# Patient Record
Sex: Female | Born: 1975 | Race: White | Hispanic: No | Marital: Married | Smoking: Never smoker
Health system: Southern US, Community
[De-identification: ages and names within clinical notes are randomized; demographics above are authoritative.]

## PROBLEM LIST (undated history)

## (undated) DIAGNOSIS — D649 Anemia, unspecified: Secondary | ICD-10-CM

## (undated) DIAGNOSIS — F419 Anxiety disorder, unspecified: Secondary | ICD-10-CM

## (undated) DIAGNOSIS — J302 Other seasonal allergic rhinitis: Secondary | ICD-10-CM

## (undated) HISTORY — PX: WISDOM TOOTH EXTRACTION: SHX21

---

## 2001-07-12 ENCOUNTER — Emergency Department (HOSPITAL_COMMUNITY): Admission: EM | Admit: 2001-07-12 | Discharge: 2001-07-13 | Payer: Self-pay | Admitting: Emergency Medicine

## 2008-11-14 ENCOUNTER — Encounter: Admission: RE | Admit: 2008-11-14 | Discharge: 2008-11-14 | Payer: Self-pay | Admitting: Orthopedic Surgery

## 2013-11-02 ENCOUNTER — Emergency Department (HOSPITAL_BASED_OUTPATIENT_CLINIC_OR_DEPARTMENT_OTHER): Payer: Commercial Managed Care - PPO

## 2013-11-02 ENCOUNTER — Emergency Department (HOSPITAL_BASED_OUTPATIENT_CLINIC_OR_DEPARTMENT_OTHER)
Admission: EM | Admit: 2013-11-02 | Discharge: 2013-11-03 | Disposition: A | Discharge status: Home / Self Care | Payer: Commercial Managed Care - PPO | Attending: Emergency Medicine | Admitting: Emergency Medicine

## 2013-11-02 ENCOUNTER — Encounter (HOSPITAL_BASED_OUTPATIENT_CLINIC_OR_DEPARTMENT_OTHER): Payer: Self-pay | Admitting: Emergency Medicine

## 2013-11-02 DIAGNOSIS — Y9289 Other specified places as the place of occurrence of the external cause: Secondary | ICD-10-CM | POA: Insufficient documentation

## 2013-11-02 DIAGNOSIS — S52122A Displaced fracture of head of left radius, initial encounter for closed fracture: Secondary | ICD-10-CM

## 2013-11-02 DIAGNOSIS — Y9389 Activity, other specified: Secondary | ICD-10-CM | POA: Insufficient documentation

## 2013-11-02 DIAGNOSIS — S52123A Displaced fracture of head of unspecified radius, initial encounter for closed fracture: Secondary | ICD-10-CM | POA: Insufficient documentation

## 2013-11-02 DIAGNOSIS — W010XXA Fall on same level from slipping, tripping and stumbling without subsequent striking against object, initial encounter: Secondary | ICD-10-CM | POA: Insufficient documentation

## 2013-11-02 DIAGNOSIS — S6990XA Unspecified injury of unspecified wrist, hand and finger(s), initial encounter: Secondary | ICD-10-CM

## 2013-11-02 DIAGNOSIS — S59909A Unspecified injury of unspecified elbow, initial encounter: Secondary | ICD-10-CM | POA: Insufficient documentation

## 2013-11-02 DIAGNOSIS — S59919A Unspecified injury of unspecified forearm, initial encounter: Secondary | ICD-10-CM

## 2013-11-02 MED ORDER — OXYCODONE-ACETAMINOPHEN 5-325 MG PO TABS
1.0000 | ORAL_TABLET | Freq: Once | ORAL | Status: AC
  Administered 2013-11-02: 1 via ORAL
  Filled 2013-11-02: qty 1

## 2013-11-02 MED ORDER — ONDANSETRON 8 MG PO TBDP
8.0000 mg | ORAL_TABLET | Freq: Once | ORAL | Status: AC
  Administered 2013-11-02: 8 mg via ORAL
  Filled 2013-11-02: qty 1

## 2013-11-02 NOTE — ED Notes (Addendum)
Pt fell on wet kitchen floor, landing on left elbow and left hip.  Pain most intense in left elbow.  Pt is walking without apparent difficulty. Took Ibuprofen 600mg  at 2215.

## 2013-11-02 NOTE — ED Provider Notes (Signed)
CSN: [id number]     Arrival date & time 11/02/13  2225 History  This chart was scribed for Peggy Gaskinsonald W Azariya Freeman, MD by Modena JanskyAlbert Thayil, ED Scribe. This patient was seen in room MH08/MH08 and the patient's care was started at 11:40 PM.   Chief Complaint  Patient presents with  . Arm Injury   Patient is a 38 y.o. female presenting with arm injury. The history is provided by the patient and the spouse. No language interpreter was used.  Arm Injury Location:  Arm and elbow Time since incident:  3 hours Arm location:  L arm Elbow location:  L elbow Pain details:    Quality:  Aching   Radiates to:  Does not radiate   Severity:  Moderate   Onset quality:  Sudden   Timing:  Constant   Progression:  Unchanged Chronicity:  New Dislocation: no   Foreign body present:  No foreign bodies  HPI Comments: [name] is a 38 y.o. female who presents to the Emergency Department complaining of a left upper extremity injury that occurred today. She states that she slipped and fell after mopping the kitchen. She states that hit her buttocks and left arm on the floor. She reports some numbness and tingling in the arm. She denies any LOC or abdominal pain.   PMH -none History  Substance Use Topics  . Smoking status: Never Smoker   . Smokeless tobacco: Not on file  . Alcohol Use: Not on file     Comment: Occ wine.  Had some this evening.   OB History   Grav Para Term Preterm Abortions TAB SAB Ect Mult Living                 Review of Systems  Gastrointestinal: Negative for abdominal pain.  Musculoskeletal: Positive for myalgias.  Neurological: Negative for syncope.  All other systems reviewed and are negative.    Allergies  Review of patient's allergies indicates no known allergies.  Home Medications   Prior to Admission medications   Not on File   BP 109/79  Pulse 57  Temp(Src) 98.6 F (37 C) (Oral)  Resp 16  Ht 5\' 9"  (1.753 m)  Wt 140 lb (63.504 kg)  BMI 20.67 kg/m2  SpO2 96%   LMP 10/27/2013 Physical Exam CONSTITUTIONAL: Well developed/well nourished HEAD: Normocephalic/atraumatic ENMT: Mucous membranes moist NECK: supple no meningeal signs SPINE:entire spine nontender CV: S1/S2 noted, no murmurs/rubs/gallops noted LUNGS: Lungs are clear to auscultation bilaterally, no apparent distress ABDOMEN: soft, nontender, no rebound or guarding NEURO: Pt is awake/alert, moves all extremitiesx4, pt is ambulatory  EXTREMITIES: pulses normal, full ROM, tenderness to left olecranon, no tenderness of ROM with left shoulder or wrist, no tenderness with ROM of lower extremities Distal radial/media/ulnar motor/sensory intact on left UE SKIN: warm, color normal PSYCH: no abnormalities of mood noted  ED Course  Procedures SPLINT APPLICATION Date/Time: 12:44 AM Authorized by: Peggy GaskinsWICKLINE,Josten Warmuth W Consent: Verbal consent obtained. Risks and benefits: risks, benefits and alternatives were discussed Consent given by: patient Splint applied by: orthopedic technician Location details: left upper extremity Splint type: long arm posterior Supplies used: fiberglass Post-procedure: The splinted body part was neurovascularly unchanged following the procedure. Patient tolerance: Patient tolerated the procedure well with no immediate complications.    DIAGNOSTIC STUDIES: Oxygen Saturation is 96% on RA, normal by my interpretation.    COORDINATION OF CARE: 11:44 PM- Pt advised of plan for treatment which includes medication and labs and pt agrees.  Advised need for  ortho f/u Pt agreeable  Imaging Review Dg Elbow Complete Left  11/03/2013   CLINICAL DATA:  Fall, at elbow pain.  EXAM: LEFT ELBOW - COMPLETE 3+ VIEW  COMPARISON:  None.  FINDINGS: Comminuted and impacted fracture of the radial head neck junction and alignment. No definite intra-articular extension. No dislocation. No destructive bony lesions. Small anterior joint effusion.  IMPRESSION: Impacted radial head neck junction  fracture without dislocation.   Electronically Signed   By: Awilda Metro   On: 11/03/2013 00:06     MDM   Final diagnoses:  Left radial head fracture, closed, initial encounter    Nursing notes including past medical history and social history reviewed and considered in documentation xrays reviewed and considered  I personally performed the services described in this documentation, which was scribed in my presence. The recorded information has been reviewed and is accurate.       Peggy Gaskins, MD 11/03/13 [phone]

## 2013-11-02 NOTE — ED Notes (Signed)
Patient transported to X-ray 

## 2013-11-03 MED ORDER — OXYCODONE-ACETAMINOPHEN 5-325 MG PO TABS: 1.0000 | ORAL_TABLET | ORAL | Status: DC | PRN

## 2013-11-03 MED ORDER — ONDANSETRON HCL 8 MG PO TABS: 8.0000 mg | ORAL_TABLET | Freq: Three times a day (TID) | ORAL | Status: DC | PRN

## 2013-11-03 NOTE — Discharge Instructions (Signed)

## 2015-06-27 ENCOUNTER — Encounter (HOSPITAL_COMMUNITY): Payer: Self-pay | Admitting: *Deleted

## 2015-06-27 NOTE — H&P (Signed)
[name]  DICTATION # [id number] CSN# [id number]   [name],[name], MD 06/27/2015 9:19 AM

## 2015-06-27 NOTE — H&P (Signed)
NAMDelton Hawkins:  Hawkins, [name]                 ACCOUNT NO.:  [id number]  MEDICAL RECORD NO.:  [id number]  LOCATION:  [facility]IO                         FACILITY:  [facility]  PHYSICIAN:  [name], M.D.DATE OF BIRTH:  February 02, 1976  DATE OF ADMISSION:  06/26/2015 DATE OF DISCHARGE:                             HISTORY & PHYSICAL   CHIEF COMPLAINT:  Missed AB.  HPI:  A 40 year old, G1, P0, seen as a transfer OB in the office yesterday where her pregnancy confirmation ultrasound showed 9 week IUP. There was no FHR movement noted.  She presents now for D and E.  This procedure including specific risks related to bleeding, infection, other complications such as perforation, may require additional surgery, reviewed with her.  She is A negative.  PAST MEDICAL HISTORY:  Allergies:  None.  There is no significant surgical or medical history.  Please see the [facility] form for family and social history.  PHYSICAL EXAM:  VITAL SIGNS:  Temp 98.2, blood pressure 120/78. HEENT:  Unremarkable. NECK:  Supple without masses. LUNGS:  Clear. CARDIOVASCULAR:  Regular rate and rhythm without murmurs, rubs, gallops noted. BREASTS:  Without masses. ABDOMEN:  Soft, flat, nontender.  Vulva, vagina, cervix normal.  Uterus 9 week size mid position.  Adnexa negative. EXTREMITIES:  Unremarkable. NEUROLOGIC:  Unremarkable.  IMPRESSION:  A 9-week missed abortion.  PLAN:  D and E procedure and risks discussed as above.     Martie Fulgham M. Marcelle Hawkins, M.D.     [facility]/MEDQ  D:  06/27/2015  T:  06/27/2015  Job:  [id number]

## 2015-06-28 ENCOUNTER — Ambulatory Visit (HOSPITAL_COMMUNITY): Payer: Commercial Managed Care - PPO | Admitting: Anesthesiology

## 2015-06-28 ENCOUNTER — Ambulatory Visit (HOSPITAL_COMMUNITY)
Admission: RE | Admit: 2015-06-28 | Discharge: 2015-06-28 | Disposition: A | Discharge status: Home / Self Care | Payer: Commercial Managed Care - PPO | Source: Ambulatory Visit | Attending: Obstetrics and Gynecology | Admitting: Obstetrics and Gynecology

## 2015-06-28 ENCOUNTER — Encounter (HOSPITAL_COMMUNITY)
Admission: RE | Disposition: A | Discharge status: Home / Self Care | Payer: Self-pay | Source: Ambulatory Visit | Attending: Obstetrics and Gynecology

## 2015-06-28 ENCOUNTER — Encounter (HOSPITAL_COMMUNITY): Payer: Self-pay | Admitting: *Deleted

## 2015-06-28 DIAGNOSIS — O021 Missed abortion: Secondary | ICD-10-CM | POA: Insufficient documentation

## 2015-06-28 DIAGNOSIS — Z3A09 9 weeks gestation of pregnancy: Secondary | ICD-10-CM | POA: Insufficient documentation

## 2015-06-28 HISTORY — PX: DILATION AND EVACUATION: SHX1459

## 2015-06-28 HISTORY — DX: Other seasonal allergic rhinitis: J30.2

## 2015-06-28 HISTORY — DX: Anxiety disorder, unspecified: F41.9

## 2015-06-28 HISTORY — DX: Anemia, unspecified: D64.9

## 2015-06-28 LAB — TYPE AND SCREEN
ABO/RH(D): A NEG
ANTIBODY SCREEN: NEGATIVE

## 2015-06-28 LAB — CBC
HCT: 39 % (ref 36.0–46.0)
Hemoglobin: 13.4 g/dL (ref 12.0–15.0)
MCH: 33.6 pg (ref 26.0–34.0)
MCHC: 34.4 g/dL (ref 30.0–36.0)
MCV: 97.7 fL (ref 78.0–100.0)
PLATELETS: 192 10*3/uL (ref 150–400)
RBC: 3.99 MIL/uL (ref 3.87–5.11)
RDW: 13.8 % (ref 11.5–15.5)
WBC: 5.2 10*3/uL (ref 4.0–10.5)

## 2015-06-28 LAB — RH IG WORKUP (INCLUDES ABO/RH)
ABO/RH(D): A NEG
Gestational Age(Wks): 10
Unit division: 0

## 2015-06-28 LAB — ABO/RH: ABO/RH(D): A NEG

## 2015-06-28 SURGERY — DILATION AND EVACUATION, UTERUS
Anesthesia: Monitor Anesthesia Care | Site: Vagina

## 2015-06-28 MED ORDER — ONDANSETRON HCL 4 MG/2ML IJ SOLN
INTRAMUSCULAR | Status: DC | PRN
  Administered 2015-06-28: 4 mg via INTRAVENOUS

## 2015-06-28 MED ORDER — MIDAZOLAM HCL 2 MG/2ML IJ SOLN
INTRAMUSCULAR | Status: AC
  Filled 2015-06-28: qty 2

## 2015-06-28 MED ORDER — KETOROLAC TROMETHAMINE 30 MG/ML IJ SOLN
INTRAMUSCULAR | Status: DC | PRN
  Administered 2015-06-28: 30 mg via INTRAVENOUS

## 2015-06-28 MED ORDER — IBUPROFEN 200 MG PO TABS: 600.0000 mg | ORAL_TABLET | Freq: Four times a day (QID) | ORAL | Status: DC | PRN

## 2015-06-28 MED ORDER — FENTANYL CITRATE (PF) 100 MCG/2ML IJ SOLN
INTRAMUSCULAR | Status: DC | PRN
  Administered 2015-06-28 (×2): 50 ug via INTRAVENOUS

## 2015-06-28 MED ORDER — OXYCODONE-ACETAMINOPHEN 5-325 MG PO TABS: 1.0000 | ORAL_TABLET | ORAL | Status: DC | PRN

## 2015-06-28 MED ORDER — LIDOCAINE HCL (CARDIAC) 20 MG/ML IV SOLN
INTRAVENOUS | Status: DC | PRN
  Administered 2015-06-28: 50 mg via INTRAVENOUS

## 2015-06-28 MED ORDER — PROPOFOL 10 MG/ML IV BOLUS
INTRAVENOUS | Status: AC
  Filled 2015-06-28: qty 20

## 2015-06-28 MED ORDER — SCOPOLAMINE 1 MG/3DAYS TD PT72
1.0000 | MEDICATED_PATCH | Freq: Once | TRANSDERMAL | Status: DC
  Administered 2015-06-28: 1.5 mg via TRANSDERMAL

## 2015-06-28 MED ORDER — MIDAZOLAM HCL 5 MG/5ML IJ SOLN
INTRAMUSCULAR | Status: DC | PRN
  Administered 2015-06-28: 2 mg via INTRAVENOUS

## 2015-06-28 MED ORDER — PROPOFOL 10 MG/ML IV BOLUS
INTRAVENOUS | Status: DC | PRN
  Administered 2015-06-28 (×6): 30 mg via INTRAVENOUS

## 2015-06-28 MED ORDER — LIDOCAINE HCL 1 % IJ SOLN
INTRAMUSCULAR | Status: DC | PRN
  Administered 2015-06-28: 17 mL

## 2015-06-28 MED ORDER — DEXAMETHASONE SODIUM PHOSPHATE 4 MG/ML IJ SOLN
INTRAMUSCULAR | Status: AC
  Filled 2015-06-28: qty 1

## 2015-06-28 MED ORDER — LIDOCAINE HCL (CARDIAC) 20 MG/ML IV SOLN
INTRAVENOUS | Status: AC
  Filled 2015-06-28: qty 5

## 2015-06-28 MED ORDER — ONDANSETRON HCL 4 MG/2ML IJ SOLN: 4.0000 mg | Freq: Once | INTRAMUSCULAR | Status: DC | PRN

## 2015-06-28 MED ORDER — FENTANYL CITRATE (PF) 100 MCG/2ML IJ SOLN
INTRAMUSCULAR | Status: AC
  Filled 2015-06-28: qty 2

## 2015-06-28 MED ORDER — DEXAMETHASONE SODIUM PHOSPHATE 4 MG/ML IJ SOLN
INTRAMUSCULAR | Status: DC | PRN
  Administered 2015-06-28: 4 mg via INTRAVENOUS

## 2015-06-28 MED ORDER — ONDANSETRON HCL 4 MG/2ML IJ SOLN
INTRAMUSCULAR | Status: AC
  Filled 2015-06-28: qty 2

## 2015-06-28 MED ORDER — LACTATED RINGERS IV SOLN
INTRAVENOUS | Status: DC
  Administered 2015-06-28 (×2): via INTRAVENOUS

## 2015-06-28 MED ORDER — SCOPOLAMINE 1 MG/3DAYS TD PT72
MEDICATED_PATCH | TRANSDERMAL | Status: AC
  Administered 2015-06-28: 1.5 mg via TRANSDERMAL
  Filled 2015-06-28: qty 1

## 2015-06-28 MED ORDER — KETOROLAC TROMETHAMINE 30 MG/ML IJ SOLN
INTRAMUSCULAR | Status: AC
  Filled 2015-06-28: qty 1

## 2015-06-28 MED ORDER — FENTANYL CITRATE (PF) 100 MCG/2ML IJ SOLN: 25.0000 ug | INTRAMUSCULAR | Status: DC | PRN

## 2015-06-28 MED ORDER — LIDOCAINE HCL 1 % IJ SOLN
INTRAMUSCULAR | Status: AC
  Filled 2015-06-28: qty 20

## 2015-06-28 MED ORDER — RHO D IMMUNE GLOBULIN 1500 UNIT/2ML IJ SOSY
300.0000 ug | PREFILLED_SYRINGE | Freq: Once | INTRAMUSCULAR | Status: AC
  Administered 2015-06-28: 300 ug via INTRAVENOUS
  Filled 2015-06-28: qty 2

## 2015-06-28 SURGICAL SUPPLY — 18 items
CATH ROBINSON RED A/P 16FR (CATHETERS) ×2 IMPLANT
CLOTH BEACON ORANGE TIMEOUT ST (SAFETY) ×2 IMPLANT
DECANTER SPIKE VIAL GLASS SM (MISCELLANEOUS) ×2 IMPLANT
GLOVE BIO SURGEON STRL SZ7 (GLOVE) ×4 IMPLANT
GLOVE BIOGEL PI IND STRL 7.0 (GLOVE) ×1 IMPLANT
GLOVE BIOGEL PI INDICATOR 7.0 (GLOVE) ×1
GOWN STRL REUS W/TWL LRG LVL3 (GOWN DISPOSABLE) ×5 IMPLANT
KIT BERKELEY 1ST TRIMESTER 3/8 (MISCELLANEOUS) ×2 IMPLANT
NS IRRIG 1000ML POUR BTL (IV SOLUTION) ×2 IMPLANT
PACK VAGINAL MINOR WOMEN LF (CUSTOM PROCEDURE TRAY) ×2 IMPLANT
PAD OB MATERNITY 4.3X12.25 (PERSONAL CARE ITEMS) ×2 IMPLANT
PAD PREP 24X48 CUFFED NSTRL (MISCELLANEOUS) ×2 IMPLANT
SET BERKELEY SUCTION TUBING (SUCTIONS) ×2 IMPLANT
TOWEL OR 17X24 6PK STRL BLUE (TOWEL DISPOSABLE) ×4 IMPLANT
VACURETTE 10 RIGID CVD (CANNULA) IMPLANT
VACURETTE 7MM CVD STRL WRAP (CANNULA) ×1 IMPLANT
VACURETTE 8 RIGID CVD (CANNULA) IMPLANT
VACURETTE 9 RIGID CVD (CANNULA) IMPLANT

## 2015-06-28 NOTE — Transfer of Care (Signed)
Immediate Anesthesia Transfer of Care Note  Patient: [name]  Procedure(s) Performed: Procedure(s): DILATATION AND EVACUATION (N/A)  Patient Location: PACU  Anesthesia Type:MAC  Level of Consciousness: sedated  Airway & Oxygen Therapy: Patient Spontanous Breathing and Patient connected to nasal cannula oxygen  Post-op Assessment: Report given to RN  Post vital signs: Reviewed  Last Vitals:  Filed Vitals:   06/28/15 1035  BP: 108/70  Pulse: 61  Temp: 36.7 C  Resp: 18    Complications: No apparent anesthesia complications

## 2015-06-28 NOTE — Discharge Instructions (Signed)
DISCHARGE INSTRUCTIONS: D&C / D&E The following instructions have been prepared to help you care for yourself upon your return home.   Personal hygiene:  Use sanitary pads for vaginal drainage, not tampons.  Shower the day after your procedure.  NO tub baths, pools or Jacuzzis for 2-3 weeks.  Wipe front to back after using the bathroom.  Activity and limitations:  Do NOT drive or operate any equipment for 24 hours. The effects of anesthesia are still present and drowsiness may result.  Do NOT rest in bed all day.  Walking is encouraged.  Walk up and down stairs slowly.  You may resume your normal activity in one to two days or as indicated by your physician.  Sexual activity: NO intercourse for at least 2 weeks after the procedure, or as indicated by your physician.  Diet: Eat a light meal as desired this evening. You may resume your usual diet tomorrow.  Return to work: You may resume your work activities in one to two days or as indicated by your doctor.  What to expect after your surgery: Expect to have vaginal bleeding/discharge for 2-3 days and spotting for up to 10 days. It is not unusual to have soreness for up to 1-2 weeks. You may have a slight burning sensation when you urinate for the first day. Mild cramps may continue for a couple of days. You may have a regular period in 2-6 weeks.  NO IBUPROFEN PRODUCTS (MOTRIN, ADVIL) OR ALEVE UNTIL 6:20 PM TODAY.   Call your doctor for any of the following:  Excessive vaginal bleeding, saturating and changing one pad every hour.  Inability to urinate 6 hours after discharge from hospital.  Pain not relieved by pain medication.  Fever of 100.4 F or greater.  Unusual vaginal discharge or odor.   Call for an appointment:    Patients signature: ______________________  Nurses signature ________________________  Support person's signature_______________________

## 2015-06-28 NOTE — Anesthesia Preprocedure Evaluation (Addendum)
Anesthesia Evaluation  Patient identified by MRN, date of birth, ID band Patient awake    Reviewed: Allergy & Precautions, NPO status , Patient's Chart, lab work & pertinent test results  History of Anesthesia Complications Negative for: history of anesthetic complications  Airway Mallampati: II  TM Distance: >3 FB Neck ROM: Full    Dental no notable dental hx. (+) Dental Advisory Given   Pulmonary neg pulmonary ROS,    Pulmonary exam normal breath sounds clear to auscultation       Cardiovascular negative cardio ROS Normal cardiovascular exam Rhythm:Regular Rate:Normal     Neuro/Psych PSYCHIATRIC DISORDERS Anxiety negative neurological ROS     GI/Hepatic negative GI ROS, Neg liver ROS,   Endo/Other  negative endocrine ROS  Renal/GU negative Renal ROS  negative genitourinary   Musculoskeletal negative musculoskeletal ROS (+)   Abdominal   Peds negative pediatric ROS (+)  Hematology negative hematology ROS (+)   Anesthesia Other Findings   Reproductive/Obstetrics (+) Pregnancy 9 week missed ab                             Anesthesia Physical Anesthesia Plan  ASA: II  Anesthesia Plan: MAC   Post-op Pain Management:    Induction: Intravenous  Airway Management Planned: Nasal Cannula  Additional Equipment:   Intra-op Plan:   Post-operative Plan: Extubation in OR  Informed Consent: I have reviewed the patients History and Physical, chart, labs and discussed the procedure including the risks, benefits and alternatives for the proposed anesthesia with the patient or authorized representative who has indicated his/her understanding and acceptance.   Dental advisory given  Plan Discussed with: CRNA  Anesthesia Plan Comments:         Anesthesia Quick Evaluation

## 2015-06-28 NOTE — Anesthesia Postprocedure Evaluation (Signed)
Anesthesia Post Note  Patient: [name]  Procedure(s) Performed: Procedure(s) (LRB): DILATATION AND EVACUATION (N/A)  Patient location during evaluation: PACU Anesthesia Type: MAC Level of consciousness: awake and alert Pain management: pain level controlled Vital Signs Assessment: post-procedure vital signs reviewed and stable Respiratory status: spontaneous breathing, nonlabored ventilation, respiratory function stable and patient connected to nasal cannula oxygen Cardiovascular status: stable and blood pressure returned to baseline Anesthetic complications: no    Last Vitals:  Filed Vitals:   06/28/15 1231 06/28/15 1245  BP: 102/64 95/69  Pulse: 56 46  Temp: 36.6 C   Resp: 16 16    Last Pain: There were no vitals filed for this visit.               Jiles GarterJACKSON,Velisa Regnier EDWARD

## 2015-06-28 NOTE — Progress Notes (Signed)
The patient was re-examined with no change in status 

## 2015-06-28 NOTE — Op Note (Signed)
Preoperative diagnosis: Missed AB  Postoperative diagnosis: Same  Procedure: D&E  Surgeon: Marcelle OverlieHolland  Anesthesia: Gen.  EBL: 50 cc  Procedure and findings:  Patient taken the operating room after an adequate level of general anesthesia was obtained with the patient's legs in stirrups the perineum and vagina were prepped and draped and the bladder was drained. Appropriate timeouts were taken at that point.  EUA was carried out the uterus was 8-9 weeks size mid anterior, adnexa negative. Weighted speculum was positioned paracervical block was then created by infiltrating at 3 and 9:00 submucosally, 5-7 cc 1% plain Xylocaine at each site after negative aspiration. The uterus sounded to 9 cm, progressively dilated to a 27 Pratt dilator, #7 curved suction curet was then used to curette a moderate amount of tissue. When no further tissue could be removed a small blunt curette was used to explore the cavity revealing it to be clean there was minimal bleeding. She tolerated this well went to recovery room in good condition.  Dictated with [facility]  Kirk Sampley Milana ObeyM Burel Kahre M.D.

## 2015-06-29 LAB — RH IG WORKUP (INCLUDES ABO/RH)
ABO/RH(D): A NEG
GESTATIONAL AGE(WKS): 10
Unit division: 0

## 2015-07-02 ENCOUNTER — Encounter (HOSPITAL_COMMUNITY): Payer: Self-pay | Admitting: Obstetrics and Gynecology

## 2015-10-14 IMAGING — CR DG ELBOW COMPLETE 3+V*L*
4 series · 4 of 4 positions shown · non-contrast
Comparison: None.

CLINICAL DATA: Fall, at elbow pain.

EXAM:
LEFT ELBOW - COMPLETE 3+ VIEW

[x elbow joint obl. left (1 of 3)]
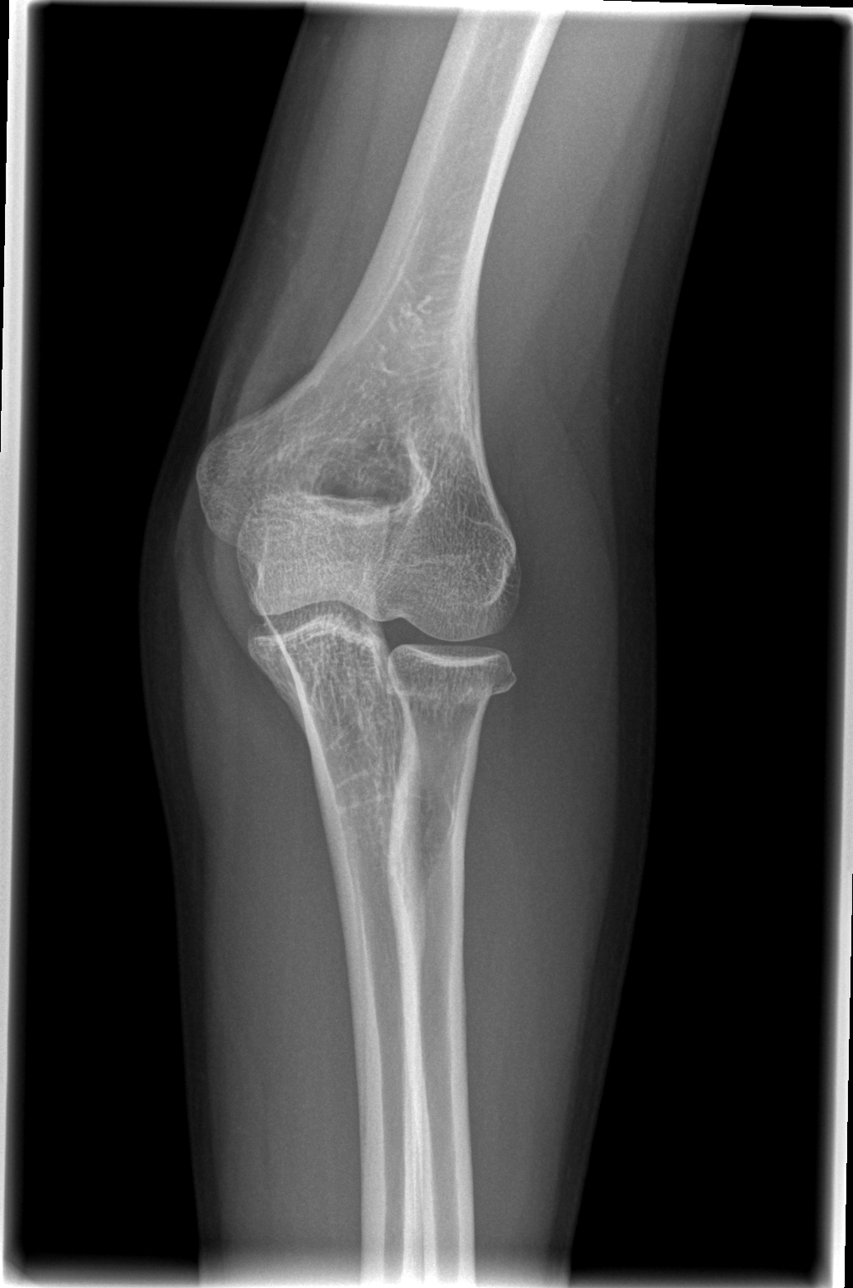

[x elbow joint obl. left (2 of 3)]
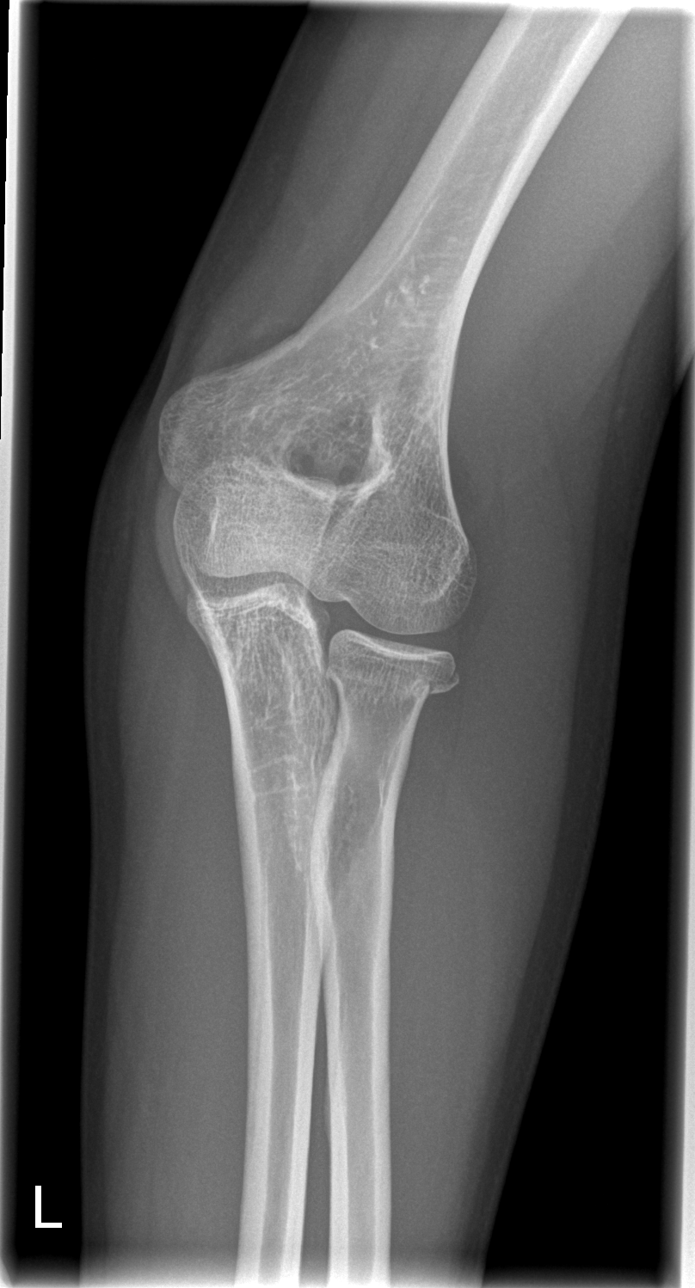

[x elbow joint obl. left (3 of 3)]
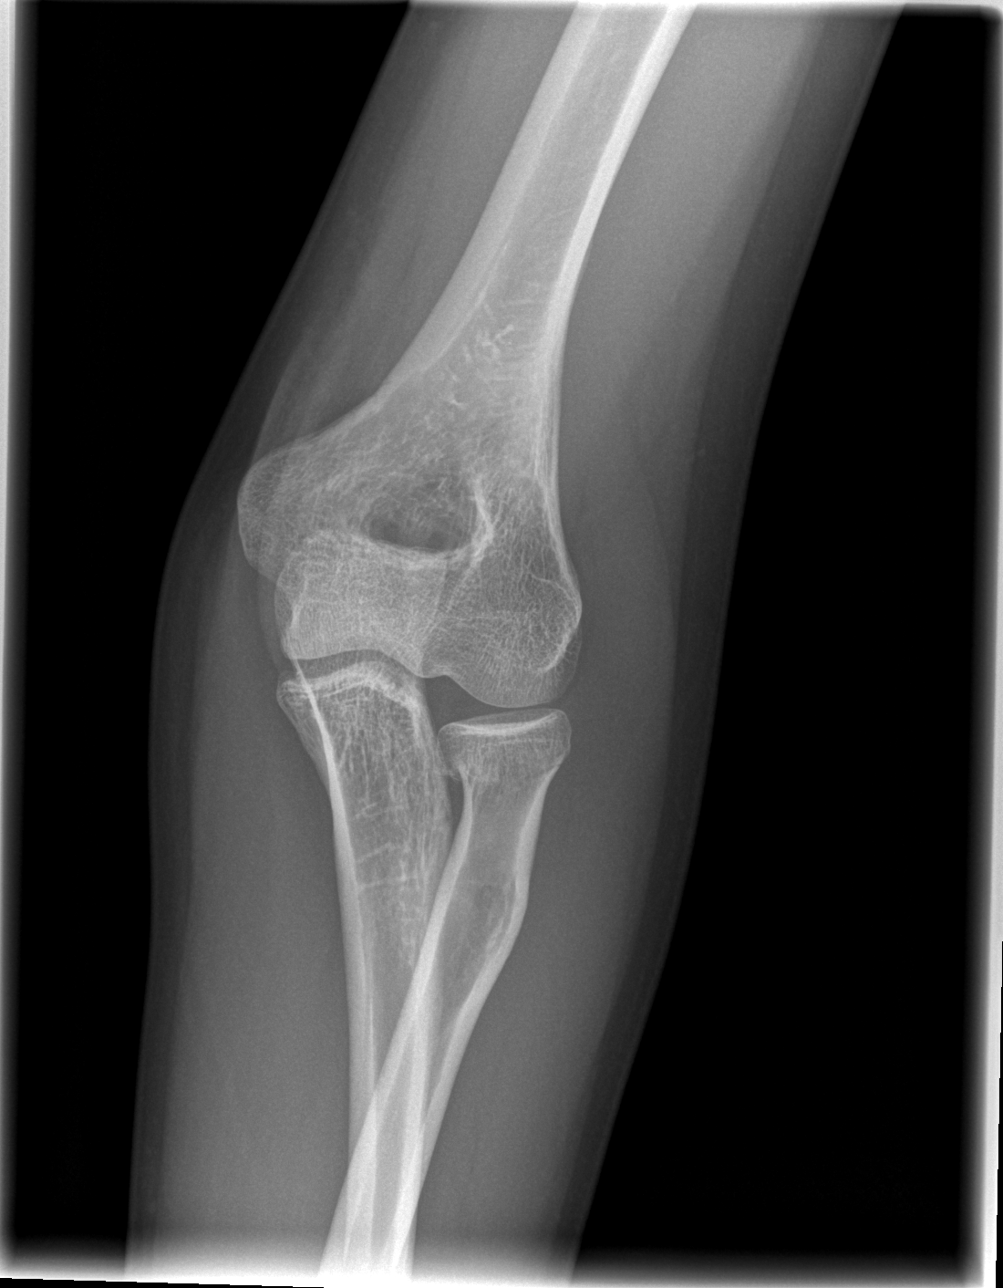

[x elbow joint lat left]
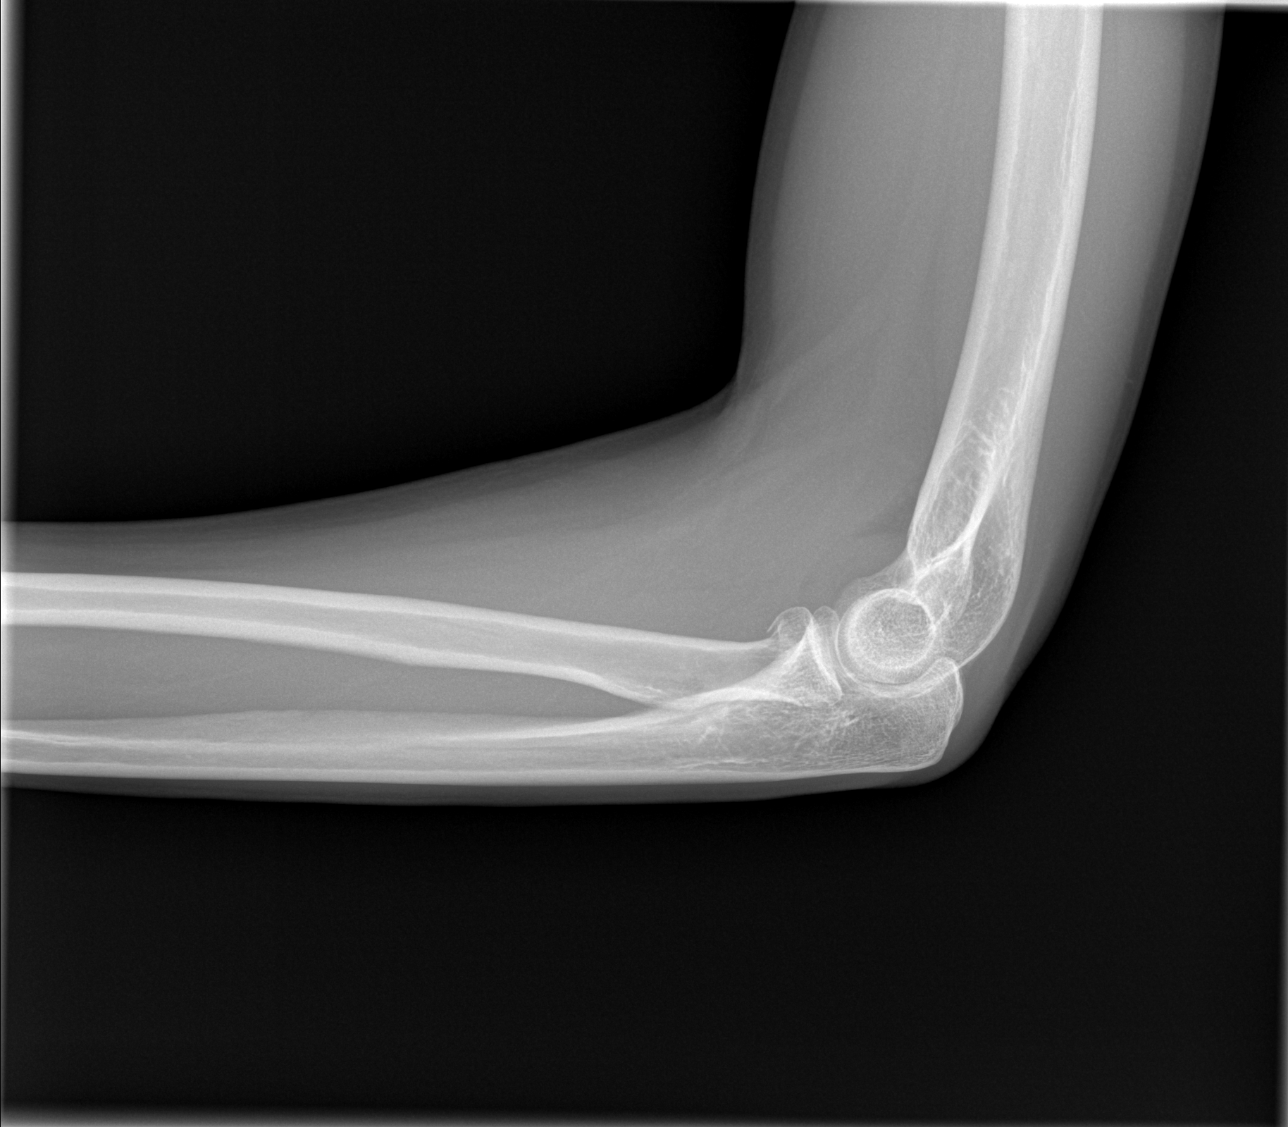

[4 of 4 positions shown; findings below may reference images not displayed]

FINDINGS: Comminuted and impacted fracture of the radial head neck junction
and alignment. No definite intra-articular extension. No
dislocation. No destructive bony lesions. Small anterior joint
effusion.
IMPRESSION: Impacted radial head neck junction fracture without dislocation.

  By: Carlitox Bautista Ticona

## 2015-11-09 ENCOUNTER — Encounter: Payer: Self-pay | Admitting: Family Medicine

## 2015-11-09 ENCOUNTER — Ambulatory Visit (INDEPENDENT_AMBULATORY_CARE_PROVIDER_SITE_OTHER): Payer: Commercial Managed Care - PPO | Admitting: Family Medicine

## 2015-11-09 VITALS — BP 100/60 | HR 59 | Temp 98.4°F | Resp 16 | Ht 69.5 in | Wt 144.8 lb

## 2015-11-09 DIAGNOSIS — Z0000 Encounter for general adult medical examination without abnormal findings: Secondary | ICD-10-CM

## 2015-11-09 DIAGNOSIS — N912 Amenorrhea, unspecified: Secondary | ICD-10-CM

## 2015-11-09 DIAGNOSIS — Z23 Encounter for immunization: Secondary | ICD-10-CM

## 2015-11-09 DIAGNOSIS — Z136 Encounter for screening for cardiovascular disorders: Secondary | ICD-10-CM

## 2015-11-09 DIAGNOSIS — Z87440 Personal history of urinary (tract) infections: Secondary | ICD-10-CM

## 2015-11-09 DIAGNOSIS — Z13220 Encounter for screening for lipoid disorders: Secondary | ICD-10-CM

## 2015-11-09 DIAGNOSIS — IMO0001 Reserved for inherently not codable concepts without codable children: Secondary | ICD-10-CM

## 2015-11-09 LAB — CBC WITH DIFFERENTIAL/PLATELET
BASOS PCT: 1 %
Basophils Absolute: 30 cells/uL (ref 0–200)
EOS PCT: 2 %
Eosinophils Absolute: 60 cells/uL (ref 15–500)
HEMATOCRIT: 39.5 % (ref 35.0–45.0)
HEMOGLOBIN: 13.7 g/dL (ref 11.7–15.5)
LYMPHS ABS: 1110 {cells}/uL (ref 850–3900)
Lymphocytes Relative: 37 %
MCH: 32.9 pg (ref 27.0–33.0)
MCHC: 34.7 g/dL (ref 32.0–36.0)
MCV: 94.7 fL (ref 80.0–100.0)
MONO ABS: 540 {cells}/uL (ref 200–950)
MPV: 9.4 fL (ref 7.5–12.5)
Monocytes Relative: 18 %
NEUTROS ABS: 1260 {cells}/uL — AB (ref 1500–7800)
NEUTROS PCT: 42 %
Platelets: 216 10*3/uL (ref 140–400)
RBC: 4.17 MIL/uL (ref 3.80–5.10)
RDW: 12.8 % (ref 11.0–15.0)
WBC: 3 10*3/uL — AB (ref 3.8–10.8)

## 2015-11-09 LAB — POC MICROSCOPIC URINALYSIS (UMFC): MUCUS RE: ABSENT

## 2015-11-09 LAB — COMPLETE METABOLIC PANEL WITH GFR
ALK PHOS: 51 U/L (ref 33–115)
ALT: 18 U/L (ref 6–29)
AST: 20 U/L (ref 10–30)
Albumin: 4.5 g/dL (ref 3.6–5.1)
BUN: 12 mg/dL (ref 7–25)
CALCIUM: 9.6 mg/dL (ref 8.6–10.2)
CHLORIDE: 102 mmol/L (ref 98–110)
CO2: 25 mmol/L (ref 20–31)
Creat: 0.81 mg/dL (ref 0.50–1.10)
GFR, Est African American: >89 mL/min (ref >=60)
Glucose, Bld: 94 mg/dL (ref 65–99)
POTASSIUM: 4.1 mmol/L (ref 3.5–5.3)
Sodium: 136 mmol/L (ref 135–146)
Total Bilirubin: 0.8 mg/dL (ref 0.2–1.2)
Total Protein: 7.3 g/dL (ref 6.1–8.1)

## 2015-11-09 LAB — POCT URINALYSIS DIP (MANUAL ENTRY)
Bilirubin, UA: NEGATIVE
GLUCOSE UA: NEGATIVE
Ketones, POC UA: NEGATIVE
Leukocytes, UA: NEGATIVE
Nitrite, UA: NEGATIVE
PROTEIN UA: NEGATIVE
RBC UA: NEGATIVE
Spec Grav, UA: 1.01
UROBILINOGEN UA: 0.2
pH, UA: 7.5

## 2015-11-09 LAB — POCT URINE PREGNANCY: Preg Test, Ur: POSITIVE — AB

## 2015-11-09 LAB — LIPID PANEL
CHOLESTEROL: 122 mg/dL — AB (ref 125–200)
HDL: 69 mg/dL (ref >=46)
LDL Cholesterol: 45 mg/dL (ref <130)
TRIGLYCERIDES: 41 mg/dL (ref <150)
Total CHOL/HDL Ratio: 1.8 Ratio (ref <=5.0)
VLDL: 8 mg/dL (ref <30)

## 2015-11-09 LAB — TSH: TSH: 1.34 m[IU]/L

## 2015-11-09 MED ORDER — PRENATAL 19 PO CHEW: 1.0000 | CHEWABLE_TABLET | Freq: Every day | ORAL | 2 refills | Status: AC

## 2015-11-09 NOTE — Progress Notes (Signed)
Patient ID: [name], female    DOB: 07/07/1975, 40 y.o.   MRN: [id number]  PCP: Allean FoundSMITH,CANDACE THIELE, MD  No chief complaint on file.   Subjective:   HPI Presents for a complete physical exam.  40 year old Caucasian female present to the clinic today for a complete physical exam. She has no known medical history with the exception of a miscarriage back in April 2017.  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 [place]  Her only concern today is that she is hoping that she is pregnant her last menstrual period was July 27. She is also requesting screening for cholesterol and diabetes. She reports no known family history of heart disease diabetes or cancer.  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 She reports her symptoms are stable  [facility] [place]. She received her last Pap smear in 2016. [facility] [place].  She is requesting a TDAP vaccination today however declines a flu vaccination.    She reports a distant history of having a dental or vision exam. Reports no problems with vision or hearing presently.  She participates regularly in physical activity such as running and dancing and other exercises on a weekly basis.   . Social History   Social History  . Marital status: Married    Spouse name: N/A  . Number of children: N/A  . Years of education: N/A   Occupational History  . Not on file.   Social History Main Topics  . Smoking status: Never Smoker  . Smokeless tobacco: Never Used  . Alcohol use Yes     Comment: occasional wine  . Drug use: No  . Sexual activity: Yes    Birth control/ protection: None     Comment: approx [redacted] wks gestation   Other Topics Concern  . Not on file   Social History Narrative  . No  narrative on file    No family history on file.  Review of Systems  Respiratory: Negative.   Cardiovascular: Negative.   Gastrointestinal: Negative.   Genitourinary:       Missed period. She and her husband are trying to become pregnant.  Psychiatric/Behavioral: Negative.    There are no active problems to display for this patient.    Prior to Admission medications   Medication Sig Start Date End Date Taking? Authorizing Provider  DiphenhydrAMINE HCl (BENADRYL PO) Take 1 tablet by mouth daily as needed (sleep and allergies).    Historical Provider, MD  ibuprofen (ADVIL) 200 MG tablet Take 3 tablets (600 mg total) by mouth every 6 (six) hours as needed for moderate pain. 06/28/15   Richarda Overlieichard Holland, MD  ibuprofen (ADVIL,MOTRIN) 200 MG tablet Take 400 mg by mouth every 6 (six) hours as needed.    Historical Provider, MD  oxyCODONE-acetaminophen (ROXICET) 5-325 MG tablet Take 1 tablet by mouth every 4 (four) hours as needed for severe pain. 06/28/15   Richarda Overlieichard Holland, MD  No Known Allergies     Objective:  Physical Exam  Constitutional: She is oriented to person, place, and time. She appears well-developed and well-nourished.  HENT:  Head: Normocephalic and atraumatic.  Right Ear: External ear normal.  Left Ear: External ear normal.  Nose: Nose normal.  Mouth/Throat: Oropharynx is clear  and moist.  Eyes: Conjunctivae are normal. Pupils are equal, round, and reactive to light.  Neck: Normal range of motion. Neck supple.  Cardiovascular: Normal rate, regular rhythm, normal heart sounds and intact distal pulses.   Pulmonary/Chest: Effort normal and breath sounds normal.  Abdominal: Soft. Bowel sounds are normal.  Musculoskeletal: Normal range of motion.  Neurological: She is alert and oriented to person, place, and time. She has normal reflexes.  Skin: Skin is warm and dry.  Psychiatric: She has a normal mood and affect. Her behavior is normal. Judgment and thought content normal.     Breast exam: Breast are supple. Negative for lymphadenopathy. Areola  are surrounded with thick coarse hair. Nipple negative for discharge or drainage. . Vitals:   11/09/15 0956  BP: 100/60  Pulse: (!) 59  Resp: 16  Temp: 98.4 F (36.9 C)     Assessment & Plan:  1. Annual physical exam -CBC with Differential/Platelet - TSH Age appropriate anticipatory guidance provided  2. Absent menses - POCT urine pregnancy-positive Patient has a recent history of spontaneous abortion and she is of advanced maternal age.   Plan: Start prenatal vitamins today.  Advised to follow-up immediately to schedule obstetrical visit.  You estimated due date is May 3rd, 2018.   3. Encounter for cholesteral screening for cardiovascular disease - COMPLETE METABOLIC PANEL WITH GFR - Lipid panel  4. History of UTI - POCT Microscopic Urinalysis ([facility]) - POCT urinalysis dipstick Normal urine results.  Follow-up as needed.  Godfrey Pick. Tiburcio Pea, MSN, FNP-C [facility]

## 2015-11-09 NOTE — Patient Instructions (Addendum)
Congratulations! You are pregnant!    Schedule an appointment with your OBGYN as soon as possible.  You estimated due date is May 3rd, 2018.    This date can vary depending on the actual date of conception.  Start prenatal vitamins today!  Avoid aspirin, naproxen, and ibuprofen.  Follow-up with your primary care provider regarding continuing or discontinuing Zoloft.     IF you received an x-ray today, you will receive an invoice from [facility] Radiology. Please contact [facility] at [phone] with questions or concerns regarding your invoice.   IF you received labwork today, you will receive an invoice from [employer]. Please contact [facility] at [phone] with questions or concerns regarding your invoice.   Our billing staff will not be able to assist you with questions regarding bills from these companies.  You will be contacted with the lab results as soon as they are available. The fastest way to get your results is to activate your My Chart account. Instructions are located on the last page of this paperwork. If you have not heard from [place] regarding the results in 2 weeks, please contact this office.     pr First Trimester of Pregnancy The first trimester of pregnancy is from week 1 until the end of week 12 (months 1 through 3). A week after a sperm fertilizes an egg, the egg will implant on the wall of the uterus. This embryo will begin to develop into a baby. Genes from you and your partner are forming the baby. The female genes determine whether the baby is a boy or a girl. At 6-8 weeks, the eyes and face are formed, and the heartbeat can be seen on ultrasound. At the end of 12 weeks, all the baby's organs are formed.  Now that you are pregnant, you will want to do everything you can to have a healthy baby. Two of the most important things are to get good prenatal care and to follow your health care provider's instructions. Prenatal care  is all the medical care you receive before the baby's birth. This care will help prevent, find, and treat any problems during the pregnancy and childbirth. BODY CHANGES Your body goes through many changes during pregnancy. The changes vary from woman to woman.   You may gain or lose a couple of pounds at first.  You may feel sick to your stomach (nauseous) and throw up (vomit). If the vomiting is uncontrollable, call your health care provider.  You may tire easily.  You may develop headaches that can be relieved by medicines approved by your health care provider.  You may urinate more often. Painful urination may mean you have a bladder infection.  You may develop heartburn as a result of your pregnancy.  You may develop constipation because certain hormones are causing the muscles that push waste through your intestines to slow down.  You may develop hemorrhoids or swollen, bulging veins (varicose veins).  Your breasts may begin to grow larger and become tender. Your nipples may stick out more, and the tissue that surrounds them (areola) may become darker.  Your gums may bleed and may be sensitive to brushing and flossing.  Dark spots or blotches (chloasma, mask of pregnancy) may develop on your face. This will likely fade after the baby is born.  Your menstrual periods will stop.  You may have a loss of appetite.  You may develop cravings for certain kinds of food.  You may have changes in your  emotions from day to day, such as being excited to be pregnant or being concerned that something may go wrong with the pregnancy and baby.  You may have more vivid and strange dreams.  You may have changes in your hair. These can include thickening of your hair, rapid growth, and changes in texture. Some women also have hair loss during or after pregnancy, or hair that feels dry or thin. Your hair will most likely return to normal after your baby is born. WHAT TO EXPECT AT YOUR PRENATAL  VISITS During a routine prenatal visit:  You will be weighed to make sure you and the baby are growing normally.  Your blood pressure will be taken.  Your abdomen will be measured to track your baby's growth.  The fetal heartbeat will be listened to starting around week 10 or 12 of your pregnancy.  Test results from any previous visits will be discussed. Your health care provider may ask you:  How you are feeling.  If you are feeling the baby move.  If you have had any abnormal symptoms, such as leaking fluid, bleeding, severe headaches, or abdominal cramping.  If you are using any tobacco products, including cigarettes, chewing tobacco, and electronic cigarettes.  If you have any questions. Other tests that may be performed during your first trimester include:  Blood tests to find your blood type and to check for the presence of any previous infections. They will also be used to check for low iron levels (anemia) and Rh antibodies. Later in the pregnancy, blood tests for diabetes will be done along with other tests if problems develop.  Urine tests to check for infections, diabetes, or protein in the urine.  An ultrasound to confirm the proper growth and development of the baby.  An amniocentesis to check for possible genetic problems.  Fetal screens for spina bifida and Down syndrome.  You may need other tests to make sure you and the baby are doing well.  HIV (human immunodeficiency virus) testing. Routine prenatal testing includes screening for HIV, unless you choose not to have this test. HOME CARE INSTRUCTIONS  Medicines  Follow your health care provider's instructions regarding medicine use. Specific medicines may be either safe or unsafe to take during pregnancy.  Take your prenatal vitamins as directed.  If you develop constipation, try taking a stool softener if your health care provider approves. Diet  Eat regular, well-balanced meals. Choose a variety of  foods, such as meat or vegetable-based protein, fish, milk and low-fat dairy products, vegetables, fruits, and whole grain breads and cereals. Your health care provider will help you determine the amount of weight gain that is right for you.  Avoid raw meat and uncooked cheese. These carry germs that can cause birth defects in the baby.  Eating four or five small meals rather than three large meals a day may help relieve nausea and vomiting. If you start to feel nauseous, eating a few soda crackers can be helpful. Drinking liquids between meals instead of during meals also seems to help nausea and vomiting.  If you develop constipation, eat more high-fiber foods, such as fresh vegetables or fruit and whole grains. Drink enough fluids to keep your urine clear or pale yellow. Activity and Exercise  Exercise only as directed by your health care provider. Exercising will help you:  Control your weight.  Stay in shape.  Be prepared for labor and delivery.  Experiencing pain or cramping in the lower abdomen or low back  is a good sign that you should stop exercising. Check with your health care provider before continuing normal exercises.  Try to avoid standing for long periods of time. Move your legs often if you must stand in one place for a long time.  Avoid heavy lifting.  Wear low-heeled shoes, and practice good posture.  You may continue to have sex unless your health care provider directs you otherwise. Relief of Pain or Discomfort  Wear a good support bra for breast tenderness.   Take warm sitz baths to soothe any pain or discomfort caused by hemorrhoids. Use hemorrhoid cream if your health care provider approves.   Rest with your legs elevated if you have leg cramps or low back pain.  If you develop varicose veins in your legs, wear support hose. Elevate your feet for 15 minutes, 3-4 times a day. Limit salt in your diet. Prenatal Care  Schedule your prenatal visits by the  twelfth week of pregnancy. They are usually scheduled monthly at first, then more often in the last 2 months before delivery.  Write down your questions. Take them to your prenatal visits.  Keep all your prenatal visits as directed by your health care provider. Safety  Wear your seat belt at all times when driving.  Make a list of emergency phone numbers, including numbers for family, friends, the hospital, and police and fire departments. General Tips  Ask your health care provider for a referral to a local prenatal education class. Begin classes no later than at the beginning of month 6 of your pregnancy.  Ask for help if you have counseling or nutritional needs during pregnancy. Your health care provider can offer advice or refer you to specialists for help with various needs.  Do not use hot tubs, steam rooms, or saunas.  Do not douche or use tampons or scented sanitary pads.  Do not cross your legs for long periods of time.  Avoid cat litter boxes and soil used by cats. These carry germs that can cause birth defects in the baby and possibly loss of the fetus by miscarriage or stillbirth.  Avoid all smoking, herbs, alcohol, and medicines not prescribed by your health care provider. Chemicals in these affect the formation and growth of the baby.  Do not use any tobacco products, including cigarettes, chewing tobacco, and electronic cigarettes. If you need help quitting, ask your health care provider. You may receive counseling support and other resources to help you quit.  Schedule a dentist appointment. At home, brush your teeth with a soft toothbrush and be gentle when you floss. SEEK MEDICAL CARE IF:   You have dizziness.  You have mild pelvic cramps, pelvic pressure, or nagging pain in the abdominal area.  You have persistent nausea, vomiting, or diarrhea.  You have a bad smelling vaginal discharge.  You have pain with urination.  You notice increased swelling in your  face, hands, legs, or ankles. SEEK IMMEDIATE MEDICAL CARE IF:   You have a fever.  You are leaking fluid from your vagina.  You have spotting or bleeding from your vagina.  You have severe abdominal cramping or pain.  You have rapid weight gain or loss.  You vomit blood or material that looks like coffee grounds.  You are exposed to [place] measles and have never had them.  You are exposed to fifth disease or chickenpox.  You develop a severe headache.  You have shortness of breath.  You have any kind of trauma, such as  from a fall or a car accident.   This information is not intended to replace advice given to you by your health care provider. Make sure you discuss any questions you have with your health care provider.   Document Released: 02/25/2001 Document Revised: 03/24/2014 Document Reviewed: 01/11/2013 Elsevier Interactive Patient Education [employer]2016 Elsevier Inc.

## 2015-11-19 ENCOUNTER — Emergency Department (HOSPITAL_BASED_OUTPATIENT_CLINIC_OR_DEPARTMENT_OTHER): Payer: Commercial Managed Care - PPO

## 2015-11-19 ENCOUNTER — Emergency Department (HOSPITAL_BASED_OUTPATIENT_CLINIC_OR_DEPARTMENT_OTHER)
Admission: EM | Admit: 2015-11-19 | Discharge: 2015-11-19 | Disposition: A | Discharge status: Home / Self Care | Payer: Commercial Managed Care - PPO | Attending: Emergency Medicine | Admitting: Emergency Medicine

## 2015-11-19 ENCOUNTER — Encounter (HOSPITAL_BASED_OUTPATIENT_CLINIC_OR_DEPARTMENT_OTHER): Payer: Self-pay | Admitting: *Deleted

## 2015-11-19 DIAGNOSIS — O4690 Antepartum hemorrhage, unspecified, unspecified trimester: Secondary | ICD-10-CM

## 2015-11-19 DIAGNOSIS — Z3490 Encounter for supervision of normal pregnancy, unspecified, unspecified trimester: Secondary | ICD-10-CM

## 2015-11-19 DIAGNOSIS — Z3A01 Less than 8 weeks gestation of pregnancy: Secondary | ICD-10-CM | POA: Insufficient documentation

## 2015-11-19 DIAGNOSIS — O468X1 Other antepartum hemorrhage, first trimester: Secondary | ICD-10-CM

## 2015-11-19 DIAGNOSIS — O208 Other hemorrhage in early pregnancy: Secondary | ICD-10-CM | POA: Insufficient documentation

## 2015-11-19 DIAGNOSIS — O209 Hemorrhage in early pregnancy, unspecified: Secondary | ICD-10-CM | POA: Diagnosis present

## 2015-11-19 DIAGNOSIS — O418X10 Other specified disorders of amniotic fluid and membranes, first trimester, not applicable or unspecified: Secondary | ICD-10-CM

## 2015-11-19 MED ORDER — RHO D IMMUNE GLOBULIN 1500 UNIT/2ML IJ SOSY
50.0000 ug | PREFILLED_SYRINGE | Freq: Once | INTRAMUSCULAR | Status: AC
  Administered 2015-11-19: 49.5 ug via INTRAMUSCULAR

## 2015-11-19 MED ORDER — RHO D IMMUNE GLOBULIN 1500 UNIT/2ML IJ SOSY
PREFILLED_SYRINGE | INTRAMUSCULAR | Status: AC
  Filled 2015-11-19: qty 2

## 2015-11-19 NOTE — ED Notes (Signed)
Telephoned [facility] to ask about the Rhogam.  Confirmed dose in Units since order is in mcg.  Had to override.  Did not pull up in Pyxis on the "remove" screen.

## 2015-11-19 NOTE — ED Provider Notes (Signed)
[facility] Provider Note   CSN: [id number] Arrival date & time: 11/19/15  1652  By signing my name below, I, Soijett Blue, attest that this documentation has been prepared under the direction and in the presence of Cheri Fowler, PA-C Electronically Signed: Soijett Blue, ED Scribe. 11/19/15. 7:00 PM.   History   Chief Complaint Chief Complaint  Patient presents with  . Abdominal Pain    HPI  [name] is a 40 y.o. female who presents to the Emergency Department complaining of intermittent abdominal cramping onset 3 PM today. Pt states that this is her second pregnancy and she is approximately 5 weeks. Pt notes that her first pregnancy resulted in a miscarriage 5 months ago. Patient's last menstrual period was 10/11/2015. She states that she is having associated symptoms of resolved vaginal spotting x 3 days. She states that she has not tried any medications for the relief of her symptoms. She denies fever, nausea, vomiting, diarrhea, dysuria, hematuria, and any other symptoms.    The history is provided by the patient. No language interpreter was used.    Past Medical History:  Diagnosis Date  . Anemia    history  . Anxiety    no meds currently  . Seasonal allergies     There are no active problems to display for this patient.   Past Surgical History:  Procedure Laterality Date  . DILATION AND EVACUATION N/A 06/28/2015   Procedure: DILATATION AND EVACUATION;  Surgeon: Richarda Overlie, MD;  Location: [facility];  Service: Gynecology;  Laterality: N/A;  . WISDOM TOOTH EXTRACTION      OB History    Gravida Para Term Preterm AB Living   2             SAB TAB Ectopic Multiple Live Births                   Home Medications    Prior to Admission medications   Medication Sig Start Date End Date Taking? Authorizing Provider  DiphenhydrAMINE HCl (BENADRYL PO) Take 1 tablet by mouth daily as needed (sleep and allergies).    Historical Provider, MD  Prenatal  Vit-Fe Fumarate-FA (PRENATAL 19) tablet Chew 1 tablet by mouth daily. 11/09/15   Doyle Askew, FNP    Family History History reviewed. No pertinent family history.  Social History Social History  Substance Use Topics  . Smoking status: Never Smoker  . Smokeless tobacco: Never Used  . Alcohol use Yes     Comment: occasional wine     Allergies   Review of patient's allergies indicates no known allergies.   Review of Systems Review of Systems  Gastrointestinal: Positive for abdominal pain (cramping). Negative for diarrhea, nausea and vomiting.  Genitourinary: Positive for vaginal bleeding (resolved vaginal spotting). Negative for dysuria and hematuria.     Physical Exam Updated Vital Signs BP 111/67 (BP Location: Right Arm)   Pulse 66   Temp 97.9 F (36.6 C) (Oral)   Resp 18   Ht 5\' 9"  (1.753 m)   Wt 65.8 kg   LMP 10/11/2015 (Exact Date)   SpO2 100%   BMI 21.41 kg/m   Physical Exam  Constitutional: She is oriented to person, place, and time. She appears well-developed and well-nourished.  Non-toxic appearance. She does not have a sickly appearance. She does not appear ill.  HENT:  Head: Normocephalic and atraumatic.  Mouth/Throat: Oropharynx is clear and moist.  Eyes: Conjunctivae are normal. Pupils are equal, round, and reactive  to light.  Neck: Normal range of motion. Neck supple.  Cardiovascular: Normal rate and regular rhythm.   Pulmonary/Chest: Effort normal and breath sounds normal. No accessory muscle usage or stridor. No respiratory distress. She has no wheezes. She has no rhonchi. She has no rales.  Abdominal: Soft. Bowel sounds are normal. She exhibits no distension. There is no tenderness.  Genitourinary:  Genitourinary Comments: Chaperone present.  No vaginal bleeding.  No cervical discharge.  Cervical os closed.  No CMT or adnexal tenderness.   Musculoskeletal: Normal range of motion.  Lymphadenopathy:    She has no cervical adenopathy.    Neurological: She is alert and oriented to person, place, and time.  Speech clear without dysarthria.  Skin: Skin is warm and dry.  Psychiatric: She has a normal mood and affect. Her behavior is normal.     ED Treatments / Results  DIAGNOSTIC STUDIES: Oxygen Saturation is 99% on RA, nl by my interpretation.    COORDINATION OF CARE: 6:52 PM Discussed treatment plan with pt at bedside which includes [place] and pt agreed to plan.  6:57 PM- Pt was offered labs to be drawn, to which the pt declined stating that "I've already had a bunch of labs drawn within the past week."   Labs (all labs ordered are listed, but only abnormal results are displayed) Labs Reviewed - No data to display  EKG  EKG Interpretation None       Radiology US Ob Comp Less 14 Wks  Result Date: 11/19/2015 CLINICAL DATA:  Pregnant patient with intermittent midline pelvic cramping for approximately 2 hours. EXAM: OBSTETRIC <14 WK [place] AND TRANSVAGINAL OB US TECHNIQUE: Both transabdominal and transvaginal ultrasound examinations were performed for complete evaluation of the gestation as well as the maternal uterus, adnexal regions, and pelvic cul-de-sac. Transvaginal technique was performed to assess early pregnancy. COMPARISON:  None. FINDINGS: Intrauterine gestational sac: Single, normal appearance. Yolk sac:  Visualized. Embryo:  Visualized. Cardiac Activity: Detected Heart Rate: 104  bpm CRL:  0.27  cm   5 w   6 d                  [place] EDC: 07/15/2016 Subchorionic hemorrhage: A subchorionic hemorrhage measuring 2.0 x 0.5 x 0.4 cm is identified Maternal uterus/adnexae: Simple cyst in left ovary measures 3.4 x 2.6 x 2.9 cm. The right ovary appears normal. IMPRESSION: Single living intrauterine pregnancy. Subchorionic hemorrhage measuring 2.0 x 0.5 x 0.4 cm identified. Simple left ovarian cyst. Electronically Signed   By: Drusilla Kanner M.D.   On: 11/19/2015 18:24   US Ob Transvaginal  Result Date: 11/19/2015 CLINICAL DATA:   Pregnant patient with intermittent midline pelvic cramping for approximately 2 hours. EXAM: OBSTETRIC <14 WK [place] AND TRANSVAGINAL OB US TECHNIQUE: Both transabdominal and transvaginal ultrasound examinations were performed for complete evaluation of the gestation as well as the maternal uterus, adnexal regions, and pelvic cul-de-sac. Transvaginal technique was performed to assess early pregnancy. COMPARISON:  None. FINDINGS: Intrauterine gestational sac: Single, normal appearance. Yolk sac:  Visualized. Embryo:  Visualized. Cardiac Activity: Detected Heart Rate: 104  bpm CRL:  0.27  cm   5 w   6 d                  [place] EDC: 07/15/2016 Subchorionic hemorrhage: A subchorionic hemorrhage measuring 2.0 x 0.5 x 0.4 cm is identified Maternal uterus/adnexae: Simple cyst in left ovary measures 3.4 x 2.6 x 2.9 cm. The right ovary appears normal. IMPRESSION:  Single living intrauterine pregnancy. Subchorionic hemorrhage measuring 2.0 x 0.5 x 0.4 cm identified. Simple left ovarian cyst. Electronically Signed   By: Drusilla Kannerhomas  Dalessio M.D.   On: 11/19/2015 18:24    Procedures Procedures (including critical care time)  Medications Ordered in ED Medications  rho (d) immune globulin (RHIG/RHOPHYLAC) 1500 UNIT/2ML injection (not administered)  rho (d) immune globulin (RHIG/RHOPHYLAC) injection 49.5 mcg (49.5 mcg Intramuscular Given 11/19/15 1935)     Initial Impression / Assessment and Plan / ED Course  I have reviewed the triage vital signs and the nursing notes.  Pertinent labs & imaging results that were available during my care of the patient were reviewed by me and considered in my medical decision making (see chart for details).  Clinical Course   Patient refused blood work.  Upon chart review, patient is A negative.  Patient received Rhogam in ED.  Cervical os closed.  Concern for ectopic.  Ultrasound shows IUP and subchorionic hemorrhage.  Discussed these findings with the patient.  Follow up GYN.  Return  precautions discussed.  Stable for discharge.    Final Clinical Impressions(s) / ED Diagnoses   Final diagnoses:  Intrauterine pregnancy  Subchorionic hemorrhage, first trimester  Vaginal bleeding during pregnancy, antepartum    New Prescriptions New Prescriptions   No medications on file    I personally performed the services described in this documentation, which was scribed in my presence. The recorded information has been reviewed and is accurate.      Cheri FowlerKayla Michaiah Maiden, PA-C 11/19/15 1942    Loren Raceravid Yelverton, MD 11/19/15 [phone]

## 2015-11-19 NOTE — ED Triage Notes (Signed)
Pt reports that she is pregnant-LMP July 27th. States vaginal spotting that started Friday.  No spotting since that time. Reports lower abdominal pain that started today.

## 2016-03-27 ENCOUNTER — Other Ambulatory Visit (HOSPITAL_COMMUNITY): Payer: Self-pay | Admitting: Obstetrics and Gynecology

## 2016-03-27 DIAGNOSIS — Z3A26 26 weeks gestation of pregnancy: Secondary | ICD-10-CM

## 2016-03-27 DIAGNOSIS — Z3689 Encounter for other specified antenatal screening: Secondary | ICD-10-CM

## 2016-03-28 ENCOUNTER — Encounter (HOSPITAL_COMMUNITY): Payer: Self-pay | Admitting: Obstetrics and Gynecology

## 2016-04-04 ENCOUNTER — Encounter (HOSPITAL_COMMUNITY): Payer: Self-pay | Admitting: *Deleted

## 2016-04-08 ENCOUNTER — Encounter (HOSPITAL_COMMUNITY): Payer: Self-pay

## 2016-04-08 ENCOUNTER — Ambulatory Visit (HOSPITAL_COMMUNITY)
Admission: RE | Admit: 2016-04-08 | Discharge: 2016-04-08 | Disposition: A | Discharge status: Home / Self Care | Payer: Commercial Managed Care - PPO | Source: Ambulatory Visit | Attending: Family Medicine | Admitting: Family Medicine

## 2016-04-08 ENCOUNTER — Ambulatory Visit (HOSPITAL_COMMUNITY)
Admission: RE | Admit: 2016-04-08 | Discharge: 2016-04-08 | Disposition: A | Discharge status: Home / Self Care | Payer: Commercial Managed Care - PPO | Source: Ambulatory Visit | Attending: Obstetrics and Gynecology | Admitting: Obstetrics and Gynecology

## 2016-04-08 DIAGNOSIS — Z363 Encounter for antenatal screening for malformations: Secondary | ICD-10-CM | POA: Diagnosis not present

## 2016-04-08 DIAGNOSIS — O09522 Supervision of elderly multigravida, second trimester: Secondary | ICD-10-CM | POA: Diagnosis not present

## 2016-04-08 DIAGNOSIS — Z3A27 27 weeks gestation of pregnancy: Secondary | ICD-10-CM | POA: Diagnosis not present

## 2016-04-08 DIAGNOSIS — Z3A26 26 weeks gestation of pregnancy: Secondary | ICD-10-CM

## 2016-04-08 DIAGNOSIS — O359XX0 Maternal care for (suspected) fetal abnormality and damage, unspecified, not applicable or unspecified: Secondary | ICD-10-CM | POA: Diagnosis not present

## 2016-04-08 DIAGNOSIS — Z3689 Encounter for other specified antenatal screening: Secondary | ICD-10-CM

## 2016-04-22 ENCOUNTER — Other Ambulatory Visit (HOSPITAL_COMMUNITY): Payer: Self-pay

## 2016-04-22 ENCOUNTER — Encounter (HOSPITAL_COMMUNITY): Payer: Self-pay

## 2016-07-02 ENCOUNTER — Telehealth: Payer: Self-pay | Admitting: Pediatrics

## 2016-07-02 NOTE — Telephone Encounter (Signed)
I was requested by OB (indirectly, via MFM team at [facility] meeting) to offer consultation to this patient regarding potential fetal anomalies discovered prenatally.  My role is to facilitate ongoing Goals of Care discussions with family and clinical staff.  I attempted to reach mother by phone to offer consultation and set up a prenatal appointment for further discussion if desired.  I was unable to leave a voicemail message (mailbox full). Will attempt again later today. If unable to reach mother directly today, I will attempt to reach her spouse: Krisi Azua at [phone], or email this patient at [email or web address].  I may be reached at any time by calling [phone].  Delfino Lovett MD [facility]: [facility]

## 2016-07-13 ENCOUNTER — Encounter (HOSPITAL_COMMUNITY): Payer: Self-pay | Admitting: *Deleted

## 2016-07-13 ENCOUNTER — Inpatient Hospital Stay (HOSPITAL_COMMUNITY)
Admission: AD | Admit: 2016-07-13 | Discharge: 2016-07-13 | Disposition: A | Discharge status: Home / Self Care | Payer: Commercial Managed Care - PPO | Source: Ambulatory Visit | Attending: Obstetrics and Gynecology | Admitting: Obstetrics and Gynecology

## 2016-07-13 DIAGNOSIS — O479 False labor, unspecified: Secondary | ICD-10-CM

## 2016-07-13 NOTE — MAU Note (Signed)
Pt presents to MAU c/o ctx pt tried to time them but felt as if it was contstant. Pt called nurse and she recommended her to come her to MAU. Pt denies LOF or bleeding.

## 2016-07-13 NOTE — MAU Note (Signed)
I have communicated with Dr.Holland and reviewed vital signs:  Vitals:   07/13/16 2215 07/13/16 2333  BP: 117/79 114/75  Pulse: 79 70  Resp: 17   Temp: 97.9 F (36.6 C) 97.3 F (36.3 C)    Vaginal exam:  Dilation: 3 Effacement (%): 70 Cervical Position: Middle Station: -2 Presentation: Vertex Exam by:: lauren fields Rn ,   Also reviewed contraction pattern and that non-stress test is reactive.  It has been documented that patient is contracting every 5 minutes with no cervical change over 1 hour not indicating active labor.  Patient denies any other complaints.  Based on this report provider has given order for discharge.  A discharge order and diagnosis entered by a provider.   Labor discharge instructions reviewed with patient.

## 2016-07-13 NOTE — Discharge Instructions (Signed)

## 2016-07-16 ENCOUNTER — Inpatient Hospital Stay (HOSPITAL_COMMUNITY)
Admission: AD | Admit: 2016-07-16 | Discharge: 2016-07-19 | DRG: 766 | Disposition: A | Discharge status: Home / Self Care | Payer: Commercial Managed Care - PPO | Source: Ambulatory Visit | Attending: Obstetrics and Gynecology | Admitting: Obstetrics and Gynecology

## 2016-07-16 ENCOUNTER — Encounter (HOSPITAL_COMMUNITY)
Admission: AD | Disposition: A | Discharge status: Home / Self Care | Payer: Self-pay | Source: Ambulatory Visit | Attending: Obstetrics and Gynecology

## 2016-07-16 ENCOUNTER — Inpatient Hospital Stay (HOSPITAL_COMMUNITY): Payer: Commercial Managed Care - PPO | Admitting: Anesthesiology

## 2016-07-16 ENCOUNTER — Encounter (HOSPITAL_COMMUNITY): Payer: Self-pay

## 2016-07-16 ENCOUNTER — Inpatient Hospital Stay (HOSPITAL_COMMUNITY)
Admission: AD | Admit: 2016-07-16 | Discharge: 2016-07-16 | Disposition: A | Discharge status: Home / Self Care | Payer: Commercial Managed Care - PPO | Source: Ambulatory Visit | Attending: Obstetrics and Gynecology | Admitting: Obstetrics and Gynecology

## 2016-07-16 ENCOUNTER — Encounter (HOSPITAL_COMMUNITY): Payer: Self-pay | Admitting: *Deleted

## 2016-07-16 DIAGNOSIS — R339 Retention of urine, unspecified: Secondary | ICD-10-CM | POA: Diagnosis not present

## 2016-07-16 DIAGNOSIS — O358XX0 Maternal care for other (suspected) fetal abnormality and damage, not applicable or unspecified: Secondary | ICD-10-CM | POA: Diagnosis present

## 2016-07-16 DIAGNOSIS — O9902 Anemia complicating childbirth: Secondary | ICD-10-CM | POA: Diagnosis present

## 2016-07-16 DIAGNOSIS — O99824 Streptococcus B carrier state complicating childbirth: Secondary | ICD-10-CM | POA: Diagnosis present

## 2016-07-16 DIAGNOSIS — Z3493 Encounter for supervision of normal pregnancy, unspecified, third trimester: Secondary | ICD-10-CM | POA: Diagnosis present

## 2016-07-16 DIAGNOSIS — Z6791 Unspecified blood type, Rh negative: Secondary | ICD-10-CM | POA: Diagnosis not present

## 2016-07-16 DIAGNOSIS — O26893 Other specified pregnancy related conditions, third trimester: Secondary | ICD-10-CM | POA: Diagnosis present

## 2016-07-16 DIAGNOSIS — O479 False labor, unspecified: Secondary | ICD-10-CM

## 2016-07-16 DIAGNOSIS — D649 Anemia, unspecified: Secondary | ICD-10-CM | POA: Diagnosis present

## 2016-07-16 DIAGNOSIS — Z3A39 39 weeks gestation of pregnancy: Secondary | ICD-10-CM | POA: Diagnosis not present

## 2016-07-16 DIAGNOSIS — O324XX0 Maternal care for high head at term, not applicable or unspecified: Secondary | ICD-10-CM | POA: Diagnosis present

## 2016-07-16 DIAGNOSIS — Z3490 Encounter for supervision of normal pregnancy, unspecified, unspecified trimester: Secondary | ICD-10-CM

## 2016-07-16 LAB — CBC
HCT: 35.2 % — ABNORMAL LOW (ref 36.0–46.0)
Hemoglobin: 11.6 g/dL — ABNORMAL LOW (ref 12.0–15.0)
MCH: 29 pg (ref 26.0–34.0)
MCHC: 33 g/dL (ref 30.0–36.0)
MCV: 88 fL (ref 78.0–100.0)
PLATELETS: 214 10*3/uL (ref 150–400)
RBC: 4 MIL/uL (ref 3.87–5.11)
RDW: 14.7 % (ref 11.5–15.5)
WBC: 7.3 10*3/uL (ref 4.0–10.5)

## 2016-07-16 LAB — OB RESULTS CONSOLE ABO/RH: RH TYPE: NEGATIVE

## 2016-07-16 LAB — OB RESULTS CONSOLE RUBELLA ANTIBODY, IGM: Rubella: IMMUNE

## 2016-07-16 LAB — OB RESULTS CONSOLE GC/CHLAMYDIA
CHLAMYDIA, DNA PROBE: NEGATIVE
GC PROBE AMP, GENITAL: NEGATIVE

## 2016-07-16 LAB — OB RESULTS CONSOLE ANTIBODY SCREEN: Antibody Screen: NEGATIVE

## 2016-07-16 LAB — OB RESULTS CONSOLE GBS: GBS: POSITIVE

## 2016-07-16 LAB — OB RESULTS CONSOLE HIV ANTIBODY (ROUTINE TESTING): HIV: NONREACTIVE

## 2016-07-16 LAB — OB RESULTS CONSOLE HEPATITIS B SURFACE ANTIGEN: HEP B S AG: NEGATIVE

## 2016-07-16 LAB — OB RESULTS CONSOLE RPR: RPR: NONREACTIVE

## 2016-07-16 SURGERY — Surgical Case
Anesthesia: Epidural

## 2016-07-16 MED ORDER — SODIUM CHLORIDE 0.9 % IR SOLN
Status: DC | PRN
  Administered 2016-07-16: 1

## 2016-07-16 MED ORDER — LACTATED RINGERS IV SOLN
500.0000 mL | INTRAVENOUS | Status: DC | PRN
  Administered 2016-07-16: 500 mL via INTRAVENOUS

## 2016-07-16 MED ORDER — FENTANYL 2.5 MCG/ML BUPIVACAINE 1/10 % EPIDURAL INFUSION (WH - ANES): 14.0000 mL/h | INTRAMUSCULAR | Status: DC | PRN

## 2016-07-16 MED ORDER — MEPERIDINE HCL 25 MG/ML IJ SOLN
INTRAMUSCULAR | Status: AC
  Filled 2016-07-16: qty 1

## 2016-07-16 MED ORDER — OXYTOCIN 40 UNITS IN LACTATED RINGERS INFUSION - SIMPLE MED
1.0000 m[IU]/min | INTRAVENOUS | Status: DC
  Administered 2016-07-16: 2 m[IU]/min via INTRAVENOUS
  Filled 2016-07-16: qty 1000

## 2016-07-16 MED ORDER — SOD CITRATE-CITRIC ACID 500-334 MG/5ML PO SOLN
30.0000 mL | ORAL | Status: DC | PRN
  Administered 2016-07-16: 30 mL via ORAL
  Filled 2016-07-16: qty 15

## 2016-07-16 MED ORDER — OXYTOCIN BOLUS FROM INFUSION: 500.0000 mL | Freq: Once | INTRAVENOUS | Status: DC

## 2016-07-16 MED ORDER — LACTATED RINGERS IV SOLN
INTRAVENOUS | Status: DC | PRN
  Administered 2016-07-16 (×3): via INTRAVENOUS

## 2016-07-16 MED ORDER — LIDOCAINE HCL (PF) 1 % IJ SOLN
30.0000 mL | INTRAMUSCULAR | Status: DC | PRN
  Filled 2016-07-16: qty 30

## 2016-07-16 MED ORDER — OXYCODONE-ACETAMINOPHEN 5-325 MG PO TABS: 2.0000 | ORAL_TABLET | ORAL | Status: DC | PRN

## 2016-07-16 MED ORDER — SODIUM BICARBONATE 8.4 % IV SOLN
INTRAVENOUS | Status: DC | PRN
  Administered 2016-07-16: 5 mL via EPIDURAL

## 2016-07-16 MED ORDER — OXYCODONE-ACETAMINOPHEN 5-325 MG PO TABS: 1.0000 | ORAL_TABLET | ORAL | Status: DC | PRN

## 2016-07-16 MED ORDER — DEXAMETHASONE SODIUM PHOSPHATE 10 MG/ML IJ SOLN
INTRAMUSCULAR | Status: DC | PRN
  Administered 2016-07-16: 10 mg via INTRAVENOUS

## 2016-07-16 MED ORDER — EPHEDRINE 5 MG/ML INJ
INTRAVENOUS | Status: AC
  Filled 2016-07-16: qty 10

## 2016-07-16 MED ORDER — EPHEDRINE 5 MG/ML INJ: 10.0000 mg | INTRAVENOUS | Status: DC | PRN

## 2016-07-16 MED ORDER — ACETAMINOPHEN 325 MG PO TABS: 650.0000 mg | ORAL_TABLET | ORAL | Status: DC | PRN

## 2016-07-16 MED ORDER — OXYTOCIN 40 UNITS IN LACTATED RINGERS INFUSION - SIMPLE MED: 2.5000 [IU]/h | INTRAVENOUS | Status: DC

## 2016-07-16 MED ORDER — CEFAZOLIN SODIUM-DEXTROSE 2-3 GM-% IV SOLR
INTRAVENOUS | Status: DC | PRN
  Administered 2016-07-16: 2 g via INTRAVENOUS

## 2016-07-16 MED ORDER — EPHEDRINE 5 MG/ML INJ
10.0000 mg | INTRAVENOUS | Status: DC | PRN
  Administered 2016-07-16: 10 mg via INTRAVENOUS

## 2016-07-16 MED ORDER — ONDANSETRON HCL 4 MG/2ML IJ SOLN: 4.0000 mg | Freq: Four times a day (QID) | INTRAMUSCULAR | Status: DC | PRN

## 2016-07-16 MED ORDER — ATROPINE SULFATE 0.4 MG/ML IJ SOLN
INTRAMUSCULAR | Status: AC
  Filled 2016-07-16: qty 1

## 2016-07-16 MED ORDER — PHENYLEPHRINE 40 MCG/ML (10ML) SYRINGE FOR IV PUSH (FOR BLOOD PRESSURE SUPPORT): 80.0000 ug | PREFILLED_SYRINGE | INTRAVENOUS | Status: DC | PRN

## 2016-07-16 MED ORDER — PHENYLEPHRINE 40 MCG/ML (10ML) SYRINGE FOR IV PUSH (FOR BLOOD PRESSURE SUPPORT)
80.0000 ug | PREFILLED_SYRINGE | INTRAVENOUS | Status: DC | PRN
  Filled 2016-07-16: qty 10

## 2016-07-16 MED ORDER — LIDOCAINE HCL (PF) 1 % IJ SOLN
INTRAMUSCULAR | Status: DC | PRN
  Administered 2016-07-16 (×2): 4 mL

## 2016-07-16 MED ORDER — LACTATED RINGERS IV SOLN: 500.0000 mL | Freq: Once | INTRAVENOUS | Status: DC

## 2016-07-16 MED ORDER — MEPERIDINE HCL 25 MG/ML IJ SOLN
INTRAMUSCULAR | Status: DC | PRN
  Administered 2016-07-16 (×2): 12.5 mg via INTRAVENOUS

## 2016-07-16 MED ORDER — TERBUTALINE SULFATE 1 MG/ML IJ SOLN: 0.2500 mg | Freq: Once | INTRAMUSCULAR | Status: DC | PRN

## 2016-07-16 MED ORDER — ONDANSETRON HCL 4 MG/2ML IJ SOLN
INTRAMUSCULAR | Status: DC | PRN
  Administered 2016-07-16: 4 mg via INTRAVENOUS

## 2016-07-16 MED ORDER — OXYTOCIN 10 UNIT/ML IJ SOLN
INTRAVENOUS | Status: DC | PRN
  Administered 2016-07-16: 40 [IU] via INTRAVENOUS

## 2016-07-16 MED ORDER — FENTANYL 2.5 MCG/ML BUPIVACAINE 1/10 % EPIDURAL INFUSION (WH - ANES)
14.0000 mL/h | INTRAMUSCULAR | Status: DC | PRN
  Administered 2016-07-16 (×2): 14 mL/h via EPIDURAL
  Filled 2016-07-16 (×2): qty 100

## 2016-07-16 MED ORDER — LIDOCAINE-EPINEPHRINE (PF) 2 %-1:200000 IJ SOLN
INTRAMUSCULAR | Status: AC
  Filled 2016-07-16: qty 20

## 2016-07-16 MED ORDER — OXYTOCIN 10 UNIT/ML IJ SOLN
INTRAMUSCULAR | Status: AC
  Filled 2016-07-16: qty 4

## 2016-07-16 MED ORDER — DEXAMETHASONE SODIUM PHOSPHATE 10 MG/ML IJ SOLN
INTRAMUSCULAR | Status: AC
  Filled 2016-07-16: qty 1

## 2016-07-16 MED ORDER — PENICILLIN G POT IN DEXTROSE 60000 UNIT/ML IV SOLN
3.0000 10*6.[IU] | INTRAVENOUS | Status: DC
  Administered 2016-07-16 (×2): 3 10*6.[IU] via INTRAVENOUS
  Filled 2016-07-16 (×3): qty 50

## 2016-07-16 MED ORDER — MORPHINE SULFATE (PF) 0.5 MG/ML IJ SOLN
INTRAMUSCULAR | Status: AC
  Filled 2016-07-16: qty 10

## 2016-07-16 MED ORDER — LACTATED RINGERS IV SOLN
INTRAVENOUS | Status: DC
  Administered 2016-07-16: 12:00:00 via INTRAVENOUS

## 2016-07-16 MED ORDER — SCOPOLAMINE 1 MG/3DAYS TD PT72
MEDICATED_PATCH | TRANSDERMAL | Status: DC | PRN
  Administered 2016-07-16: 1 via TRANSDERMAL

## 2016-07-16 MED ORDER — ATROPINE SULFATE 0.4 MG/ML IJ SOLN
INTRAMUSCULAR | Status: DC | PRN
  Administered 2016-07-16: 0.4 mg via INTRAVENOUS

## 2016-07-16 MED ORDER — ONDANSETRON HCL 4 MG/2ML IJ SOLN
INTRAMUSCULAR | Status: AC
  Filled 2016-07-16: qty 2

## 2016-07-16 MED ORDER — MORPHINE SULFATE (PF) 0.5 MG/ML IJ SOLN
INTRAMUSCULAR | Status: DC | PRN
  Administered 2016-07-16: 3 mg via EPIDURAL

## 2016-07-16 MED ORDER — SCOPOLAMINE 1 MG/3DAYS TD PT72
MEDICATED_PATCH | TRANSDERMAL | Status: AC
  Filled 2016-07-16: qty 1

## 2016-07-16 MED ORDER — DEXTROSE 5 % IV SOLN
5.0000 10*6.[IU] | Freq: Once | INTRAVENOUS | Status: AC
  Administered 2016-07-16: 5 10*6.[IU] via INTRAVENOUS
  Filled 2016-07-16: qty 5

## 2016-07-16 MED ORDER — METHYLERGONOVINE MALEATE 0.2 MG/ML IJ SOLN
INTRAMUSCULAR | Status: DC | PRN
  Administered 2016-07-16: 0.2 mg via INTRAMUSCULAR

## 2016-07-16 MED ORDER — LACTATED RINGERS IV SOLN
INTRAVENOUS | Status: DC | PRN
  Administered 2016-07-16: 23:00:00 via INTRAVENOUS

## 2016-07-16 MED ORDER — FLEET ENEMA 7-19 GM/118ML RE ENEM: 1.0000 | ENEMA | RECTAL | Status: DC | PRN

## 2016-07-16 MED ORDER — DIPHENHYDRAMINE HCL 50 MG/ML IJ SOLN: 12.5000 mg | INTRAMUSCULAR | Status: DC | PRN

## 2016-07-16 SURGICAL SUPPLY — 39 items
ADH SKN CLS APL DERMABOND .7 (GAUZE/BANDAGES/DRESSINGS) ×1
APL SKNCLS STERI-STRIP NONHPOA (GAUZE/BANDAGES/DRESSINGS) ×1
BENZOIN TINCTURE PRP APPL 2/3 (GAUZE/BANDAGES/DRESSINGS) ×1 IMPLANT
CHLORAPREP W/TINT 26ML (MISCELLANEOUS) ×2 IMPLANT
CLAMP CORD UMBIL (MISCELLANEOUS) IMPLANT
CLOTH BEACON ORANGE TIMEOUT ST (SAFETY) ×2 IMPLANT
DERMABOND ADVANCED (GAUZE/BANDAGES/DRESSINGS) ×1
DERMABOND ADVANCED .7 DNX12 (GAUZE/BANDAGES/DRESSINGS) ×1 IMPLANT
DRSG OPSITE POSTOP 4X10 (GAUZE/BANDAGES/DRESSINGS) ×2 IMPLANT
ELECT REM PT RETURN 9FT ADLT (ELECTROSURGICAL) ×2
ELECTRODE REM PT RTRN 9FT ADLT (ELECTROSURGICAL) ×1 IMPLANT
EXTRACTOR VACUUM KIWI (MISCELLANEOUS) IMPLANT
GLOVE BIO SURGEON STRL SZ 6.5 (GLOVE) ×2 IMPLANT
GLOVE BIOGEL PI IND STRL 6.5 (GLOVE) ×1 IMPLANT
GLOVE BIOGEL PI IND STRL 7.0 (GLOVE) ×2 IMPLANT
GLOVE BIOGEL PI INDICATOR 6.5 (GLOVE) ×1
GLOVE BIOGEL PI INDICATOR 7.0 (GLOVE) ×2
GOWN STRL REUS W/TWL LRG LVL3 (GOWN DISPOSABLE) ×4 IMPLANT
KIT ABG SYR 3ML LUER SLIP (SYRINGE) ×2 IMPLANT
NDL HYPO 25X5/8 SAFETYGLIDE (NEEDLE) ×1 IMPLANT
NEEDLE HYPO 25X5/8 SAFETYGLIDE (NEEDLE) ×2 IMPLANT
NS IRRIG 1000ML POUR BTL (IV SOLUTION) ×2 IMPLANT
PACK C SECTION WH (CUSTOM PROCEDURE TRAY) ×2 IMPLANT
PAD OB MATERNITY 4.3X12.25 (PERSONAL CARE ITEMS) ×3 IMPLANT
PENCIL SMOKE EVAC W/HOLSTER (ELECTROSURGICAL) ×2 IMPLANT
RETRACTOR WND ALEXIS 25 LRG (MISCELLANEOUS) IMPLANT
RTRCTR WOUND ALEXIS 25CM LRG (MISCELLANEOUS)
SPONGE LAP 18X18 X RAY DECT (DISPOSABLE) ×1 IMPLANT
STRIP CLOSURE SKIN 1/2X4 (GAUZE/BANDAGES/DRESSINGS) ×1 IMPLANT
SUT PLAIN 0 NONE (SUTURE) IMPLANT
SUT PLAIN 2 0 (SUTURE) ×2
SUT PLAIN ABS 2-0 CT1 27XMFL (SUTURE) ×1 IMPLANT
SUT VIC AB 0 CT1 36 (SUTURE) ×2 IMPLANT
SUT VIC AB 0 CTX 36 (SUTURE) ×6
SUT VIC AB 0 CTX36XBRD ANBCTRL (SUTURE) ×2 IMPLANT
SUT VIC AB 4-0 KS 27 (SUTURE) IMPLANT
SUT VIC AB 4-0 PS2 18 (SUTURE) ×1 IMPLANT
TOWEL OR 17X24 6PK STRL BLUE (TOWEL DISPOSABLE) ×2 IMPLANT
TRAY FOLEY BAG SILVER LF 14FR (SET/KITS/TRAYS/PACK) IMPLANT

## 2016-07-16 NOTE — Anesthesia Preprocedure Evaluation (Signed)
Anesthesia Evaluation  Patient identified by MRN, date of birth, ID band Patient awake    Reviewed: Allergy & Precautions, NPO status , Patient's Chart, lab work & pertinent test results  History of Anesthesia Complications Negative for: history of anesthetic complications  Airway Mallampati: II  TM Distance: >3 FB Neck ROM: Full    Dental no notable dental hx. (+) Dental Advisory Given   Pulmonary neg pulmonary ROS,    Pulmonary exam normal breath sounds clear to auscultation       Cardiovascular negative cardio ROS Normal cardiovascular exam Rhythm:Regular Rate:Normal     Neuro/Psych PSYCHIATRIC DISORDERS Anxiety negative neurological ROS     GI/Hepatic negative GI ROS, Neg liver ROS,   Endo/Other  negative endocrine ROS  Renal/GU negative Renal ROS  negative genitourinary   Musculoskeletal negative musculoskeletal ROS (+)   Abdominal   Peds negative pediatric ROS (+)  Hematology negative hematology ROS (+) anemia ,   Anesthesia Other Findings   Reproductive/Obstetrics (+) Pregnancy 9 week missed ab                             Anesthesia Physical  Anesthesia Plan  ASA: II  Anesthesia Plan: Epidural   Post-op Pain Management:    Induction:   Airway Management Planned:   Additional Equipment:   Intra-op Plan:   Post-operative Plan:   Informed Consent: I have reviewed the patients History and Physical, chart, labs and discussed the procedure including the risks, benefits and alternatives for the proposed anesthesia with the patient or authorized representative who has indicated his/her understanding and acceptance.   Dental advisory given  Plan Discussed with: CRNA  Anesthesia Plan Comments:         Anesthesia Quick Evaluation

## 2016-07-16 NOTE — H&P (Signed)
[name] is a 41 y.o. female presenting for early labor. Pregnancy c/b fetal anomalies, suspect possible T18 vs distal arthrogryposes. Peds to be notified and present for delivery per MFM. OB History    Gravida Para Term Preterm AB Living   2 0 0 0 1 0   SAB TAB Ectopic Multiple Live Births   1             Past Medical History:  Diagnosis Date  . Anemia    history  . Anxiety    no meds currently  . Seasonal allergies    Past Surgical History:  Procedure Laterality Date  . DILATION AND EVACUATION N/A 06/28/2015   Procedure: DILATATION AND EVACUATION;  Surgeon: Richarda Overlie, MD;  Location: [facility];  Service: Gynecology;  Laterality: N/A;  . WISDOM TOOTH EXTRACTION     Family History: family history is not on file. Social History:  reports that she has never smoked. She has never used smokeless tobacco. She reports that she does not drink alcohol or use drugs.     Maternal Diabetes: No Genetic Screening: Normal 1st trimester screen, RR cfDNA. [place] abnormal. Maternal Ultrasounds/Referrals: Abnormal:  Findings:   Other: rocker bottom feet, clenched hands, bilateral UTS A1 wrenal pelviectasis, resolved CP cyst, possible absent NB. Fetal Ultrasounds or other Referrals:  None MFM recommended, pt and husband declined.  Maternal Substance Abuse:  No Significant Maternal Medications:  None Significant Maternal Lab Results:  None Other Comments:  None  ROS History   Height  (1.753 m), weight 77.6 kg (171 lb), last menstrual period 10/11/2015, unknown if currently breastfeeding. Exam Physical Exam  (from clinic) NAD, A&O NWOB Abd soft, nondistended, gravid SVE 3/80/-2  Prenatal labs: ABO, Rh:  neg Antibody:   Rubella:   RPR:    HBsAg:    HIV:    GBS:   +  Assessment/Plan:  40yo G2P0010 @ 39.6 wga presenting in early labor. Plan for augmentation of labor with pitocin and AROM when able. Mother has no other signifcant medical problems except is AMA, anemia (on Fe),  and has anxiety not requiring meds. This fetus is suspected to have atypical T18 vs other anaomly. While first trim sceen and cfDNA were RR, [place] with rocker bottom feet, clenched hands, bilateral UTS A1 wrenal pelviectasis, resolved CP cyst, possible absent NB. Patient and husband declined further testing/screening. Per MFM, peds is to be present at delivery.  RH neg - Rhogam eval pp. GBS +, plan for PCN per protocol.   Ranae Pila 07/16/2016, 12:25 PM

## 2016-07-16 NOTE — Anesthesia Procedure Notes (Signed)
Epidural Patient location during procedure: OB  Staffing Anesthesiologist: Lewie Loron Performed: anesthesiologist   Preanesthetic Checklist Completed: patient identified, pre-op evaluation, timeout performed, IV checked, risks and benefits discussed and monitors and equipment checked  Epidural Patient position: sitting Prep: site prepped and draped and DuraPrep Patient monitoring: heart rate, continuous pulse ox and blood pressure Approach: midline Location: L3-L4 Injection technique: LOR air and LOR saline  Needle:  Needle type: Tuohy  Needle gauge: 17 G Needle length: 9 cm Needle insertion depth: 4 cm Catheter type: closed end flexible Catheter size: 19 Gauge Catheter at skin depth: 11 cm Test dose: negative  Assessment Sensory level: T8 Events: blood not aspirated, injection not painful, no injection resistance, negative IV test and no paresthesia  Additional Notes Reason for block:procedure for pain

## 2016-07-16 NOTE — Anesthesia Pain Management Evaluation Note (Signed)
  CRNA Pain Management Visit Note  Patient: [name], 41 y.o., female  "Hello I am a member of the anesthesia team at [facility]. We have an anesthesia team available at all times to provide care throughout the hospital, including epidural management and anesthesia for C-section. I don't know your plan for the delivery whether it a natural birth, water birth, IV sedation, nitrous supplementation, doula or epidural, but we want to meet your pain goals."   1.Was your pain managed to your expectations on prior hospitalizations?   No prior hospitalizations  2.What is your expectation for pain management during this hospitalization?     Epidural  3.How can we help you reach that goal? Epidural when ready  Record the patient's initial score and the patient's pain goal.   Pain: 8  Pain Goal: 8 [facility] wants you to be able to say your pain was always managed very well.  Edison Pace 07/16/2016

## 2016-07-16 NOTE — Discharge Instructions (Signed)

## 2016-07-16 NOTE — Progress Notes (Signed)
Late entry - patient seen several times. At first encounter, AROM for thin mec, 4cm, cat 1 tracing. Currently, 9/100/0. One prolonged decel w/rapid cervical change from 7 to 9cm. Pitocin turned off. O2 applied. Position changed. Recovery to 150s with mor var and good var through-out multiple subsequent ctxns. Overall reassuring. Plan to re-start pitocin after 10 min and anticipate second stage soon.  Rosie Fate MD

## 2016-07-16 NOTE — Progress Notes (Signed)
Pt pushing with excellent effort for ~ an hour without any progress - head remains zero station, asynclitic and OP. FHT tracing reassuring. Rec CS at this time given no descent however did give option of continued pushing. Patient opts for cesarean section. Risks discussed including infection, bleeding, damage to surrounding structures, and the need for additional procedures. Consent signed. Pt desires to proceed. 2g Ancef on call to OR.    Rosie Fate MD

## 2016-07-16 NOTE — MAU Note (Signed)
Patient presents with c/o ctx every 5 mins. Patient denies any LOF but is having some bloody show. Fetus active.

## 2016-07-16 NOTE — Brief Op Note (Signed)
07/16/2016  11:52 PM  PATIENT:  Delton Coombes  41 y.o. female  PRE-OPERATIVE DIAGNOSIS:  primary cesarean section   POST-OPERATIVE DIAGNOSIS:  primary cesarean section   PROCEDURE:  Procedure(s): CESAREAN SECTION (N/A)  SURGEON:  Surgeon(s) and Role:    * Ranae Pila, MD - Primary  PHYSICIAN ASSISTANT:   ASSISTANTS: cfNA   ANESTHESIA:   epidural  EBL:  Total I/O In: 1300 [I.V.:1300] Out: 600 [Urine:600]  BLOOD ADMINISTERED:none  DRAINS: Urinary Catheter (Foley)   LOCAL MEDICATIONS USED:  NONE  SPECIMEN:  Source of Specimen:  placenta  DISPOSITION OF SPECIMEN:  L&D  COUNTS:  YES  TOURNIQUET:  * No tourniquets in log *  DICTATION: .Note written in EPIC  PLAN OF CARE: Admit to inpatient   PATIENT DISPOSITION:  PACU - hemodynamically stable.   Delay start of Pharmacological VTE agent (>24hrs) due to surgical blood loss or risk of bleeding: not applicable

## 2016-07-16 NOTE — Op Note (Signed)
PROCEDURE DATE: 07/16/2016  PREOPERATIVE DIAGNOSIS: Intrauterine pregnancy at 39.4 wga, Indication: arrest of descent  POSTOPERATIVE DIAGNOSIS:The same  PROCEDURE: primary Low TransverseCesarean Section  SURGEON: Dr. Belva Hawkins  INDICATIONS:This is a 40yo G2P0010 at 39.6wga wga requiring cesarean section secondary to arrest of descent. Admitted in early labor. Progressed well. Pushed for ~ 1 hour without any descent of the head - head felt to be asynclitic, OP, and dysmorphic. Decision made to proceed with pLTCS.The risks of cesarean section discussed with the patient included but were not limited to: bleeding which may require transfusion or reoperation; infection which may require antibiotics; injury to bowel, bladder, ureters or other surrounding organs; injury to the fetus; need for additional procedures including hysterectomy in the event of a life-threatening hemorrhage; placental abnormalities wth subsequent pregnancies, incisional problems, thromboembolic phenomenon and other postoperative/anesthesia complications. The patient agreed with the proposed plan, giving informed consent for the procedure.   FINDINGS:Viable maleinfant with some dysmorphic features in vertex presentation,APGARs pending, Weight pending, Amniotic fluid thin meconium, Intact placenta, three vessel cord. Grossly normal uterus, ovaries and fallopian tubes.  .  ANESTHESIA: Epidural ESTIMATED BLOOD LOSS: 600cc SPECIMENS: Placenta for L&D COMPLICATIONS: None immediate   PROCEDURE IN DETAIL: The patient received intravenous antibiotics (2g Ancef) and had sequential compression devices applied to her lower extremities while in the preoperative area. Shewasthen taken to the operating roomwhere epidural anesthesiawas dosed up to surgical level andwas found to be adequate. She was then placed in a dorsal supine position with a leftward tilt,and prepped and draped in a sterile manner.A foley  catheter was placed into her bladder and attached to constant gravity. After an adequate timeout was performed, aPfannenstiel skin incision was made with scalpel and carried through to the underlying layer of fascia. The fascia was incised in the midline and this incision was extended bilaterally using the Mayo scissors. Kocher clamps were applied to the superior aspect of the fascial incision and the underlying rectus muscles were dissected off bluntly. A similar process was carried out on the inferior aspect of the facial incision. The rectus muscles were separated in the midline bluntly and the peritoneum was entered bluntly. A bladder flap was created sharply and developed bluntly.Atransverse hysterotomy was made with a scalpel and extended bilaterally bluntly. The bladder blade was then removed. The infant was successfully delivered, and cord was clamped and cut and infant was handed over to awaiting neonatology team. Uterine massage was then administered and the placenta delivered intact with three-vessel cord. Cord gases were taken. The uterus was cleared of clot and debris. The hysterotomy was closed with 0 vicryl.A second imbricating suture of 0-vicryl was used to reinforce the incision and aid in hemostasis.The fascia was closed with 0-Vicryl in a running fashion with good restoration of anatomy. The subcutaneus tissue was irrigated. The skin was closed with 4-0 Vicryl in a subcuticular fashion.  Final EBL was 600cc (all surgical site and was hemostatic at end of procedure) without any further bleeding on exam.   Pt tolerated the procedure well. All sponge/lap/needle counts were correct X 2. Pt taken to recovery room in stable condition.  It's a boy - "[name]"!    Peggy Agee MD

## 2016-07-17 ENCOUNTER — Encounter (HOSPITAL_COMMUNITY): Payer: Self-pay

## 2016-07-17 LAB — CBC
HEMATOCRIT: 27.2 % — AB (ref 36.0–46.0)
Hemoglobin: 9.4 g/dL — ABNORMAL LOW (ref 12.0–15.0)
MCH: 29.9 pg (ref 26.0–34.0)
MCHC: 34.6 g/dL (ref 30.0–36.0)
MCV: 86.6 fL (ref 78.0–100.0)
PLATELETS: 197 10*3/uL (ref 150–400)
RBC: 3.14 MIL/uL — AB (ref 3.87–5.11)
RDW: 14.8 % (ref 11.5–15.5)
WBC: 17.2 10*3/uL — AB (ref 4.0–10.5)

## 2016-07-17 LAB — RPR: RPR: NONREACTIVE

## 2016-07-17 MED ORDER — IBUPROFEN 600 MG PO TABS
600.0000 mg | ORAL_TABLET | Freq: Four times a day (QID) | ORAL | Status: DC
  Administered 2016-07-17 – 2016-07-19 (×10): 600 mg via ORAL
  Filled 2016-07-17 (×10): qty 1

## 2016-07-17 MED ORDER — SCOPOLAMINE 1 MG/3DAYS TD PT72
1.0000 | MEDICATED_PATCH | Freq: Once | TRANSDERMAL | Status: DC
  Filled 2016-07-17: qty 1

## 2016-07-17 MED ORDER — DIBUCAINE 1 % RE OINT: 1.0000 "application " | TOPICAL_OINTMENT | RECTAL | Status: DC | PRN

## 2016-07-17 MED ORDER — TETANUS-DIPHTH-ACELL PERTUSSIS 5-2.5-18.5 LF-MCG/0.5 IM SUSP: 0.5000 mL | Freq: Once | INTRAMUSCULAR | Status: DC

## 2016-07-17 MED ORDER — ONDANSETRON HCL 4 MG/2ML IJ SOLN: 4.0000 mg | Freq: Three times a day (TID) | INTRAMUSCULAR | Status: DC | PRN

## 2016-07-17 MED ORDER — LACTATED RINGERS IV SOLN
INTRAVENOUS | Status: DC
  Administered 2016-07-17: 06:00:00 via INTRAVENOUS

## 2016-07-17 MED ORDER — SODIUM CHLORIDE 0.9% FLUSH: 3.0000 mL | INTRAVENOUS | Status: DC | PRN

## 2016-07-17 MED ORDER — MEPERIDINE HCL 25 MG/ML IJ SOLN: 6.2500 mg | INTRAMUSCULAR | Status: DC | PRN

## 2016-07-17 MED ORDER — OXYCODONE HCL 5 MG PO TABS: 10.0000 mg | ORAL_TABLET | ORAL | Status: DC | PRN

## 2016-07-17 MED ORDER — OXYTOCIN 40 UNITS IN LACTATED RINGERS INFUSION - SIMPLE MED: 2.5000 [IU]/h | INTRAVENOUS | Status: AC

## 2016-07-17 MED ORDER — PROMETHAZINE HCL 25 MG/ML IJ SOLN: 6.2500 mg | INTRAMUSCULAR | Status: DC | PRN

## 2016-07-17 MED ORDER — CEFAZOLIN SODIUM-DEXTROSE 2-4 GM/100ML-% IV SOLN
INTRAVENOUS | Status: AC
  Filled 2016-07-17: qty 100

## 2016-07-17 MED ORDER — DIPHENHYDRAMINE HCL 25 MG PO CAPS: 25.0000 mg | ORAL_CAPSULE | ORAL | Status: DC | PRN

## 2016-07-17 MED ORDER — NALBUPHINE HCL 10 MG/ML IJ SOLN: 5.0000 mg | Freq: Once | INTRAMUSCULAR | Status: DC | PRN

## 2016-07-17 MED ORDER — PRENATAL MULTIVITAMIN CH
1.0000 | ORAL_TABLET | Freq: Every day | ORAL | Status: DC
  Administered 2016-07-17 – 2016-07-19 (×3): 1 via ORAL
  Filled 2016-07-17 (×3): qty 1

## 2016-07-17 MED ORDER — FENTANYL CITRATE (PF) 100 MCG/2ML IJ SOLN: 25.0000 ug | INTRAMUSCULAR | Status: DC | PRN

## 2016-07-17 MED ORDER — ZOLPIDEM TARTRATE 5 MG PO TABS: 5.0000 mg | ORAL_TABLET | Freq: Every evening | ORAL | Status: DC | PRN

## 2016-07-17 MED ORDER — SIMETHICONE 80 MG PO CHEW
80.0000 mg | CHEWABLE_TABLET | Freq: Three times a day (TID) | ORAL | Status: DC
  Administered 2016-07-17 – 2016-07-19 (×8): 80 mg via ORAL
  Filled 2016-07-17 (×8): qty 1

## 2016-07-17 MED ORDER — KETOROLAC TROMETHAMINE 30 MG/ML IJ SOLN: 30.0000 mg | Freq: Four times a day (QID) | INTRAMUSCULAR | Status: AC | PRN

## 2016-07-17 MED ORDER — WITCH HAZEL-GLYCERIN EX PADS: 1.0000 "application " | MEDICATED_PAD | CUTANEOUS | Status: DC | PRN

## 2016-07-17 MED ORDER — NALBUPHINE HCL 10 MG/ML IJ SOLN: 5.0000 mg | INTRAMUSCULAR | Status: DC | PRN

## 2016-07-17 MED ORDER — DIPHENHYDRAMINE HCL 50 MG/ML IJ SOLN: 12.5000 mg | INTRAMUSCULAR | Status: DC | PRN

## 2016-07-17 MED ORDER — LACTATED RINGERS IV SOLN: INTRAVENOUS | Status: DC

## 2016-07-17 MED ORDER — MENTHOL 3 MG MT LOZG: 1.0000 | LOZENGE | OROMUCOSAL | Status: DC | PRN

## 2016-07-17 MED ORDER — KETOROLAC TROMETHAMINE 30 MG/ML IJ SOLN
INTRAMUSCULAR | Status: AC
  Filled 2016-07-17: qty 1

## 2016-07-17 MED ORDER — NALOXONE HCL 2 MG/2ML IJ SOSY
1.0000 ug/kg/h | PREFILLED_SYRINGE | INTRAVENOUS | Status: DC | PRN
  Filled 2016-07-17: qty 2

## 2016-07-17 MED ORDER — ACETAMINOPHEN 325 MG PO TABS
650.0000 mg | ORAL_TABLET | ORAL | Status: DC | PRN
  Administered 2016-07-17 – 2016-07-18 (×2): 650 mg via ORAL
  Filled 2016-07-17 (×2): qty 2

## 2016-07-17 MED ORDER — DIPHENHYDRAMINE HCL 25 MG PO CAPS: 25.0000 mg | ORAL_CAPSULE | Freq: Four times a day (QID) | ORAL | Status: DC | PRN

## 2016-07-17 MED ORDER — NALOXONE HCL 0.4 MG/ML IJ SOLN: 0.4000 mg | INTRAMUSCULAR | Status: DC | PRN

## 2016-07-17 MED ORDER — COCONUT OIL OIL
1.0000 "application " | TOPICAL_OIL | Status: DC | PRN
  Administered 2016-07-18: 1 via TOPICAL
  Filled 2016-07-17: qty 120

## 2016-07-17 MED ORDER — SIMETHICONE 80 MG PO CHEW
80.0000 mg | CHEWABLE_TABLET | ORAL | Status: DC
  Administered 2016-07-17 – 2016-07-18 (×2): 80 mg via ORAL
  Filled 2016-07-17: qty 1

## 2016-07-17 MED ORDER — SIMETHICONE 80 MG PO CHEW: 80.0000 mg | CHEWABLE_TABLET | ORAL | Status: DC | PRN

## 2016-07-17 MED ORDER — SENNOSIDES-DOCUSATE SODIUM 8.6-50 MG PO TABS
2.0000 | ORAL_TABLET | ORAL | Status: DC
  Administered 2016-07-17 – 2016-07-18 (×2): 2 via ORAL
  Filled 2016-07-17: qty 2

## 2016-07-17 MED ORDER — KETOROLAC TROMETHAMINE 30 MG/ML IJ SOLN
30.0000 mg | Freq: Four times a day (QID) | INTRAMUSCULAR | Status: AC | PRN
  Administered 2016-07-17: 30 mg via INTRAMUSCULAR

## 2016-07-17 MED ORDER — OXYCODONE HCL 5 MG PO TABS
5.0000 mg | ORAL_TABLET | ORAL | Status: DC | PRN
  Administered 2016-07-18 – 2016-07-19 (×2): 5 mg via ORAL
  Filled 2016-07-17 (×3): qty 1

## 2016-07-17 NOTE — Progress Notes (Signed)
Pt unable to void over six hours post foley catheter removal. Pt feeling increased pressure. In and out cath done with output. Pt states feeling much less pressure. Encouraged fluids and will try to void again within the next six hours.

## 2016-07-17 NOTE — Addendum Note (Signed)
Addendum  created 07/17/16 0820 by Rica RecordsAngela Ginna Schuur, CRNA   Sign clinical note

## 2016-07-17 NOTE — Anesthesia Postprocedure Evaluation (Signed)
Anesthesia Post Note  Patient: Delton Coombesmanda Largo  Procedure(s) Performed: Procedure(s) (LRB): CESAREAN SECTION (N/A)  Patient location during evaluation: PACU Anesthesia Type: Epidural Level of consciousness: oriented and awake and alert Pain management: pain level controlled Vital Signs Assessment: post-procedure vital signs reviewed and stable Respiratory status: spontaneous breathing, respiratory function stable and patient connected to nasal cannula oxygen Cardiovascular status: blood pressure returned to baseline and stable Postop Assessment: no headache, no backache and epidural receding Anesthetic complications: no        Last Vitals:  Vitals:   07/17/16 0310 07/17/16 0410  BP: (!) 111/59 (!) 107/58  Pulse: (!) 59 60  Resp: 18 18  Temp: 36.8 C 36.5 C    Last Pain:  Vitals:   07/17/16 0410  TempSrc: Oral  PainSc: 3    Pain Goal: Patients Stated Pain Goal: 5 (07/17/16 0410)               Shelton SilvasKevin D Ashmi Blas

## 2016-07-17 NOTE — Lactation Note (Signed)
This note was copied from a baby's chart. Lactation Consultation Note: Lactation brochure given to mother with basic teaching done. Mother reports that she took breastfeeding classes. Mother reports that infant has been feeding well. Infant has just had bath and mother is currently doing skin to skin. Lots of family members in room with mother .Advised mother to cue base feed infant and at least 8-12 times in 24hrs. Discussed cluster feeding . Ad[facility] to [facility] feeding.   Patient Name: Peggy Delton Coombesmanda Nees WUJWJ'XToday's Date: 07/17/2016 Reason for consult: Initial assessment   Maternal Data    Feeding Feeding Type: Breast Fed  LATCH Score/Interventions                      Lactation Tools Discussed/Used     Consult Status Consult Status: Follow-up Date: 07/17/16 Follow-up type: In-patient    Stevan BornKendrick, Torres [place] [facility] 07/17/2016, 3:14 PM

## 2016-07-17 NOTE — Anesthesia Postprocedure Evaluation (Signed)
Anesthesia Post Note  Patient: [name]  Procedure(s) Performed: Procedure(s) (LRB): CESAREAN SECTION (N/A)  Patient location during evaluation: Mother Baby Anesthesia Type: Epidural Level of consciousness: patient remains intubated per anesthesia plan Pain management: pain level controlled Vital Signs Assessment: post-procedure vital signs reviewed and stable Respiratory status: spontaneous breathing, nonlabored ventilation and respiratory function stable Cardiovascular status: stable Postop Assessment: no headache, no backache, epidural receding and patient able to bend at knees Anesthetic complications: no        Last Vitals:  Vitals:   07/17/16 0410 07/17/16 0610  BP: (!) 107/58 109/62  Pulse: 60 (!) 59  Resp: 18 18  Temp: 36.5 C 36.6 C    Last Pain:  Vitals:   07/17/16 0610  TempSrc: Oral  PainSc: 3    Pain Goal: Patients Stated Pain Goal: 5 (07/17/16 0410)               [name]

## 2016-07-17 NOTE — Telephone Encounter (Signed)
Informed by Nursery staff member that parent(s) decline Palliative/Peds Complex Care referral at this time. No further attempts will be made to contact.

## 2016-07-17 NOTE — Transfer of Care (Signed)
Immediate Anesthesia Transfer of Care Note  Patient: [name]  Procedure(s) Performed: Procedure(s): CESAREAN SECTION (N/A)  Patient Location: PACU  Anesthesia Type:Epidural  Level of Consciousness: awake, alert , oriented and patient cooperative  Airway & Oxygen Therapy: Patient Spontanous Breathing  Post-op Assessment: Report given to RN, Post -op Vital signs reviewed and stable and Patient moving all extremities X 4  Post vital signs: Reviewed and stable  Last Vitals:  Vitals:   07/16/16 2247 07/16/16 2249  BP: 130/82 139/80  Pulse: 80 76  Resp:    Temp:      Last Pain:  Vitals:   07/16/16 2248  TempSrc:   PainSc: 0-No pain         Complications: No apparent anesthesia complications

## 2016-07-18 MED ORDER — RHO D IMMUNE GLOBULIN 1500 UNIT/2ML IJ SOSY
300.0000 ug | PREFILLED_SYRINGE | Freq: Once | INTRAMUSCULAR | Status: AC
  Administered 2016-07-18: 300 ug via INTRAMUSCULAR
  Filled 2016-07-18: qty 2

## 2016-07-18 NOTE — Progress Notes (Signed)
Pt only able to void six hours after last in and out cath. Pt feeling pressure and urge to void. Bladder scan done showing in bladder. Second in and out cath resulted in out. Ice packs applied to peri. Encouraged fluids and will try again in a few hours.

## 2016-07-18 NOTE — Plan of Care (Signed)
Problem: Life Cycle: Goal: Risk for postpartum hemorrhage will decrease Outcome: Progressing A few minutes prior to routine shift assessment, pt passed golf-ball sized clot upon using the restroom. Upon fundal check, pt had no further trickling/gushing and fundus was firm, but lower than had been in previously charted asessments. Pt had no complaints of dizziness or unusual abdominal pain.

## 2016-07-18 NOTE — Plan of Care (Signed)
Problem: Fluid Volume: Goal: Ability to maintain a balanced intake and output will improve Patient is voiding well.

## 2016-07-18 NOTE — Progress Notes (Signed)
Pt unable to void since foley d/c.  No pain.  Lochia appropriate  AF, VSS Gen - NAD Abd - fundus firm.  Bandage clean Ext - NT  A/P:  POD#2 s/p primary c-section Plan voiding trials this morning.  If unable to void - will replace foley x 24 hours Infant doing well

## 2016-07-18 NOTE — Plan of Care (Signed)
Problem: Urinary Elimination: Goal: Ability to reestablish a normal urinary elimination pattern will improve Outcome: Completed/Met Date Met: 07/18/16 Voiding well after two in and out catheters.

## 2016-07-19 LAB — RH IG WORKUP (INCLUDES ABO/RH)
ABO/RH(D): A NEG
FETAL SCREEN: NEGATIVE
Gestational Age(Wks): 39.6
Unit division: 0

## 2016-07-19 MED ORDER — OXYCODONE-ACETAMINOPHEN 5-325 MG PO TABS: 1.0000 | ORAL_TABLET | ORAL | 0 refills | Status: AC | PRN

## 2016-07-19 MED ORDER — IBUPROFEN 600 MG PO TABS: 600.0000 mg | ORAL_TABLET | Freq: Four times a day (QID) | ORAL | 0 refills | Status: AC

## 2016-07-19 NOTE — Anesthesia Postprocedure Evaluation (Addendum)
Anesthesia Post Note  Patient: Delton Coombesmanda Krenz  Procedure(s) Performed: Procedure(s) (LRB): CESAREAN SECTION (N/A)  Patient location during evaluation: Mother Baby Anesthesia Type: Epidural Level of consciousness: awake and alert, oriented and patient cooperative Pain management: pain level controlled Vital Signs Assessment: post-procedure vital signs reviewed and stable Respiratory status: spontaneous breathing Cardiovascular status: stable Postop Assessment: no headache, epidural receding, patient able to bend at knees and no signs of nausea or vomiting Anesthetic complications: no Comments: Pain score 2.        Last Vitals:  Vitals:   07/18/16 1846 07/19/16 0527  BP: (!) 98/54 (!) 96/58  Pulse: 62 66  Resp: 18 17  Temp: 36.6 C 36.7 C    Last Pain:  Vitals:   07/19/16 0555  TempSrc:   PainSc: 0-No pain   Pain Goal: Patients Stated Pain Goal: 5 (07/18/16 1806)               Merrilyn PumaWRINKLE,DANA

## 2016-07-19 NOTE — Lactation Note (Signed)
This note was copied from a baby's chart. Lactation Consultation Note  Mom is reporting nipple soreness and her right breast is engorged. Utilized therapeutic breast massage of lactation to aid in soften with some success. Gerilyn PilgrimJacob became hungry during this time so he was BF.  Central tongue retraction is noted with extension and mom's nipples are sore so a #24 nipple shield was initiated. Creative positioning was used and pain decreased from a 7-8 to a 4-5. Mother reports more comfort when [facility] left cheek is pressed in closer than the left cheek.  He ate for about 40 minutes and breasts were softer. Encouraged mother to express the left breast because firm areas continued to be present. Instructed on use and cleaning of NS and frequency of BF and expression. Follow-up appointment on May 8th 2018.  Patient Name: Boy Delton Coombesmanda Rettig WUJWJ'XToday's Date: 07/19/2016 Reason for consult: Follow-up assessment   Maternal Data    Feeding Feeding Type: Breast Fed Length of feed: 40 min  LATCH Score/Interventions Latch: Repeated attempts needed to sustain latch, nipple held in mouth throughout feeding, stimulation needed to elicit sucking reflex.  Audible Swallowing: A few with stimulation (many swallows)  Type of Nipple: Everted at rest and after stimulation  Comfort (Breast/Nipple): Filling, red/small blisters or bruises, mild/mod discomfort (pain of 4-5)     Hold (Positioning): Assistance needed to correctly position infant at breast and maintain latch.  LATCH Score: 6  Lactation Tools Discussed/Used     Consult Status Consult Status: Follow-up Date: 07/22/16 Follow-up type: Out-patient    Soyla DryerJoseph, Dove Gresham 07/19/2016, 12:01 PM

## 2016-07-19 NOTE — Addendum Note (Signed)
Addendum  created 07/19/16 0824 by Angela AdamWrinkle, Marriah Sanderlin G, CRNA   Sign clinical note

## 2016-07-19 NOTE — Discharge Summary (Signed)
Obstetric Discharge Summary Reason for Admission: induction of labor Prenatal Procedures: ultrasound Intrapartum Procedures: cesarean: low cervical, transverse Postpartum Procedures: none Complications-Operative and Postpartum: none and urinary retention Hemoglobin  Date Value Ref Range Status  07/17/2016 9.4 (L) 12.0 - 15.0 g/dL Final   HCT  Date Value Ref Range Status  07/17/2016 27.2 (L) 36.0 - 46.0 % Final    Physical Exam:  General: alert and cooperative Lochia: appropriate Uterine Fundus: firm Incision: healing well, no significant drainage DVT Evaluation: No evidence of DVT seen on physical exam.  Discharge Diagnoses: Term Pregnancy-delivered  Discharge Information: Date: 07/19/2016 Activity: pelvic rest Diet: routine Medications: PNV, Ibuprofen and Percocet Condition: stable Instructions: refer to practice specific booklet Discharge to: home Follow-up Information    [place] Schedule an appointment as soon as possible for a visit in 1 week(s).   Contact information: [address] [place] [id number] [phone]           Newborn Data: Live born female  Birth Weight: 8 lb 15.6 oz (4070 g) APGAR: 8, 9  Home with mother.  Harvis Mabus 07/19/2016, 7:31 AM

## 2016-07-19 NOTE — Progress Notes (Signed)
CSW attempted twice to meet with parents to offer support and complete assessment, but MOB was working with nursing staff to pump.  CSW introduced services and will attempt again later today as CSW understands MOB and baby will be discharged today.  Parents were pleasant and thanked CSW.  They state they are doing well.  Discharge should not be delayed if parents are ready to leave before CSW is able to return again.

## 2016-07-20 LAB — BPAM RBC
Blood Product Expiration Date: 201805202359
Blood Product Expiration Date: 201805222359
Unit Type and Rh: 600
Unit Type and Rh: 600

## 2016-07-20 LAB — TYPE AND SCREEN
ABO/RH(D): A NEG
ANTIBODY SCREEN: POSITIVE
DAT, IGG: NEGATIVE
Unit division: 0
Unit division: 0

## 2016-07-22 ENCOUNTER — Ambulatory Visit: Payer: Self-pay

## 2016-07-22 NOTE — Lactation Note (Signed)
This note was copied from a baby's chart. Lactation Consult  Mother's reason for visit:  Breastfeeding going well at first. Mother became engorged. Infant couldn't latch. Mother reports that when she tried to pump nothing would come out.  Mother started to use formula until milk started to come in. Mother reports that Left breast is still somewhat engorged,. The last time infant latched was Sunday afternoon.  Mothers goal is to breastfeed exclusively.   Visit Type:  Feeding assessment. Appointment Notes:  Consult:  Initial Lactation Consultant:  Michel BickersKendrick, Zhaniya Swallows McCoy  ________________________________________________________________________    ________________________________________________________________________  Mother's Name: [name] Type of delivery:  C-Section, Low Transverse Breastfeeding Experience: none  Maternal Medical Conditions:  None Maternal Medications:  Prenatal vits  ________________________________________________________________________  Breastfeeding History (Post Discharge)  Frequency of breastfeeding: none    Supplementation  Formula:  Volume 60  ml Frequency:          Brand: Enfamil  Breastmilk:  Volume 30 ml Frequency:  4 times daily  Method:  Bottle, slow flow tommy tippee  Pumping  Type of pump:  Medela pump in style Frequency:  Every 4 times daily Volume:  2 ounces  Infant Intake and Output Assessment  Voids: 9-10  in 24 hrs.  Color:  Clear yellow Stools:  3-4 in 24 hrs.  Color:  Green  ________________________________________________________________________  Maternal Breast Assessment  Breast:  Full Nipple:  Erect Pain level:  0 Pain interventions:  Bra, Cream/oil and Expressed breast milk  _______________________________________________________________________ Feeding Assessment/Evaluation:  Mother has a very full firm engorged left breast. She has one area that is firm red and warm to touch at 8-9 o'clock, another  red firm area at 4 o'clock. Mother was advised to phone OB and inform that she may be getting mastitis. Right breast is firm but not engorged,.   Initial feeding assessment: infant placed in football hold on the left breast. I fit mother with a #20 nipple shield . Infant latched on with the nipple shield for 10 mins. Observed milk in the shield. Infant the offered the bare breast and sustained the latch for 15 mins with audible swallows. Lot of massaging done by mother and LC while infant was feeding.  When infant released the breast , there was a tiny open area with skin pealing. Mother denied having any discomfort with latch.  Infant transferred 36 ml   Infant placed in cross cradle hold on the right breast. He latched well with good depth. Mother taught breast compression. Infant sustained latch for 15 mins and transferred 54 ml . Observed slight pinching when infant released the breast.    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  Audible swallowing:  2 = Spontaneous and intermittent  Type of nipple:  2 = Everted at rest and after stimulation  Comfort (Breast/Nipple):  0 = Engorged, cracked, bleeding, large blisters, severe discomfort  Hold (Positioning):  1 = Assistance needed to correctly position infant at breast and maintain latch  LATCH score:  7  Attached assessment:  Deep  Lips flanged:  Yes.    Lips untucked:  Yes.    Suck assessment:  Displays both  Tools:  Nipple shield 20 mm Instructed on use and cleaning of tool:  Yes.    Pre-feed weight:3950, 8-11.3 Post-feed weight: 3986, 8-12.6  Amount transferred:  36 ml l  Infant's oral assessment:  Variance   Alternate breast   Positioning:  Cross cradle Right breast  LATCH documentation:  Latch:  2 = Grasps breast easily, tongue down, lips flanged,  rhythmical sucking.  Audible swallowing:  2 = Spontaneous and intermittent  Type of nipple:  2 = Everted at rest and after stimulation  Comfort (Breast/Nipple):  1 = Filling, red/small blisters or bruises, mild/mod discomfort  Hold (Positioning):  1 = Assistance needed to correctly position infant at breast and maintain latch  LATCH score:  8  Attached assessment:  Deep  Lips flanged:  Yes.    Lips untucked:  Yes.    Suck assessment:  Displays both   Pre-feed weight:  3986 Post-feed weight: 4040 Amount transferred:  54 ml   Total amount transferred:  90 ml   Advised mother to feed infant with feeding cues and at least 8-12 times in 24 hours Feed infant early Position infant well at the breast in football or cross cradle Mother to feed infant on most full breast first to soften well Use nipple to nose latch technique Advised to firm nipple first before latching May need to use a nipple shield Do reverse pressures exercise and hand express Continue to massage breast and ice breast for 15 mins every 3-4 hours If unable to get infant latched feed infant at least 2-2 1/2 ounces every 2-3 hours.  Mother to begin to pump after feeding at least 4-6 times daily Advised mother to pump when she gets home for 30 mins and remove as much milk then keep breast soft with feeding and pumping.  Follow up with Dr Eddie Candle in am Follow up in one week for weight check.  Will call LC office prn

## 2016-08-15 NOTE — Addendum Note (Signed)
Addendum  created 08/15/16 1215 by Shelton SilvasHollis, Lovelle Deitrick D, MD   Sign clinical note

## 2018-06-07 IMAGING — US US OB TRANSVAGINAL
1 series · 14 of 28 positions shown · non-contrast
Comparison: None.

CLINICAL DATA: Pregnant patient with intermittent midline pelvic
cramping for approximately 2 hours.

EXAM:
OBSTETRIC <14 WK US AND TRANSVAGINAL OB US
TECHNIQUE: Both transabdominal and transvaginal ultrasound examinations were
performed for complete evaluation of the gestation as well as the
maternal uterus, adnexal regions, and pelvic cul-de-sac.
Transvaginal technique was performed to assess early pregnancy.

[Series 1: us ob transvaginal · 0.15mm/px · 14 of 95 slices shown]
[im 4/95]
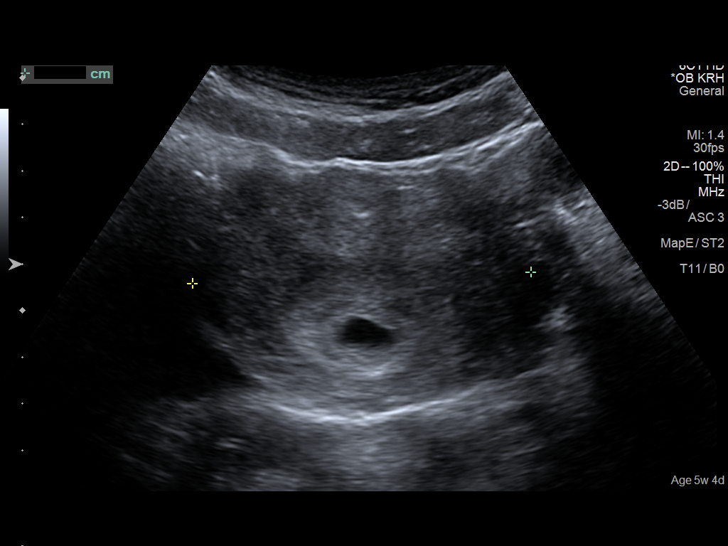
[im 11/95]
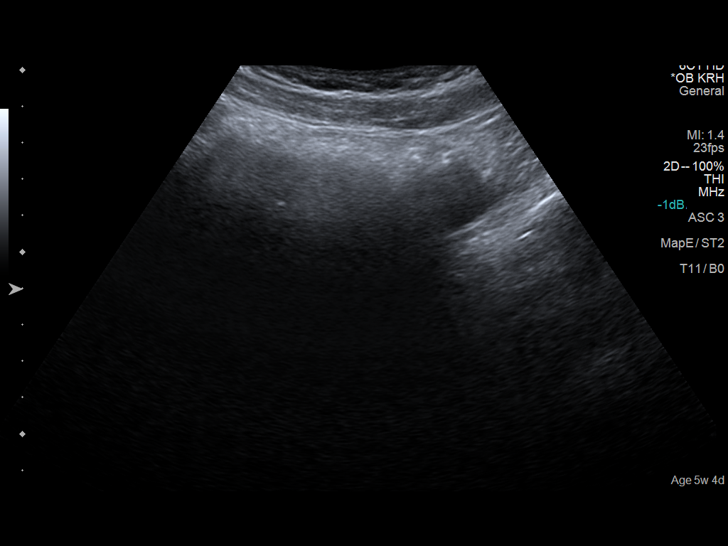
[im 18/95]
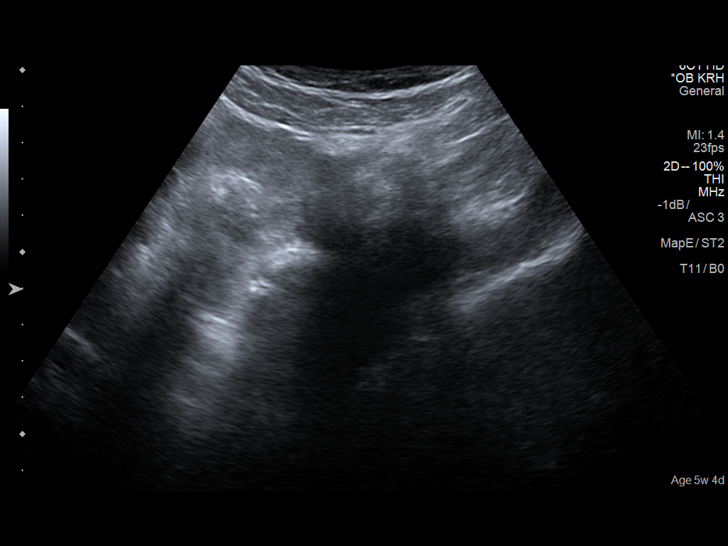
[im 25/95]
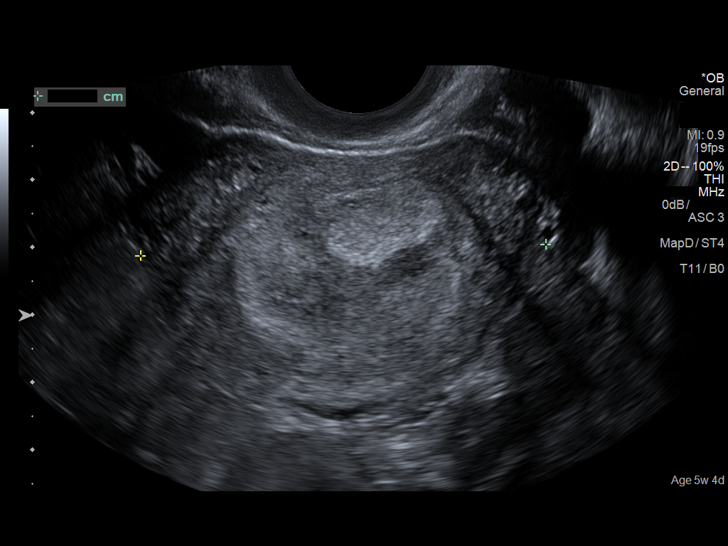
[im 32/95]
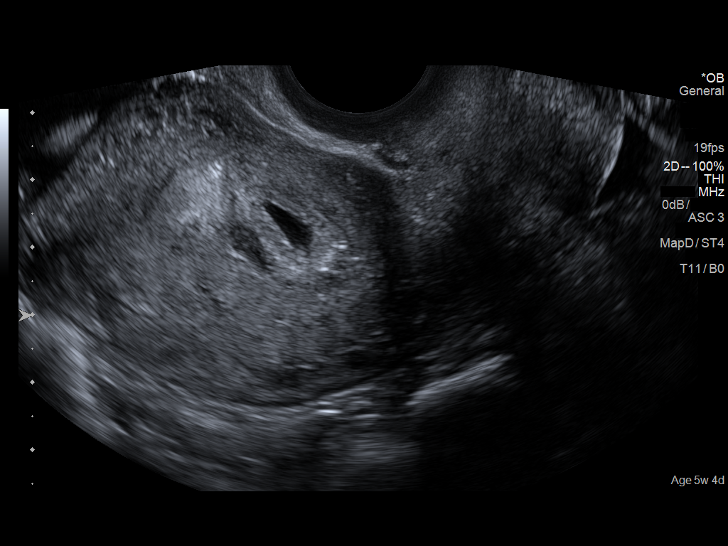
[im 39/95]
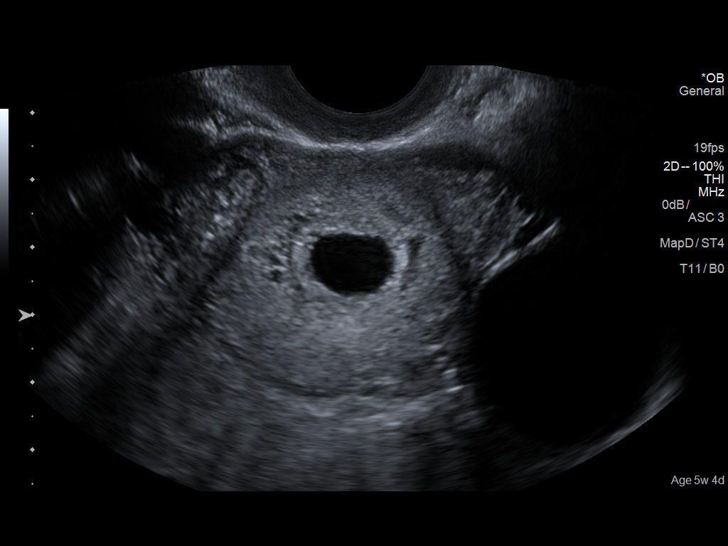
[im 46/95]
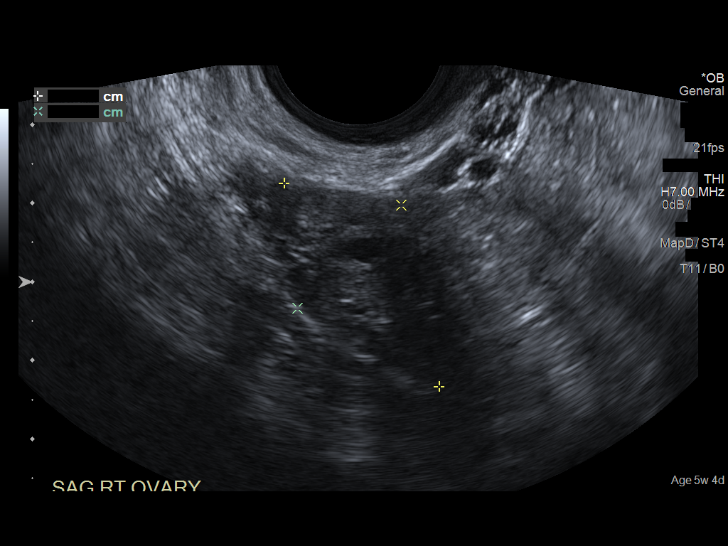
[im 53/95]
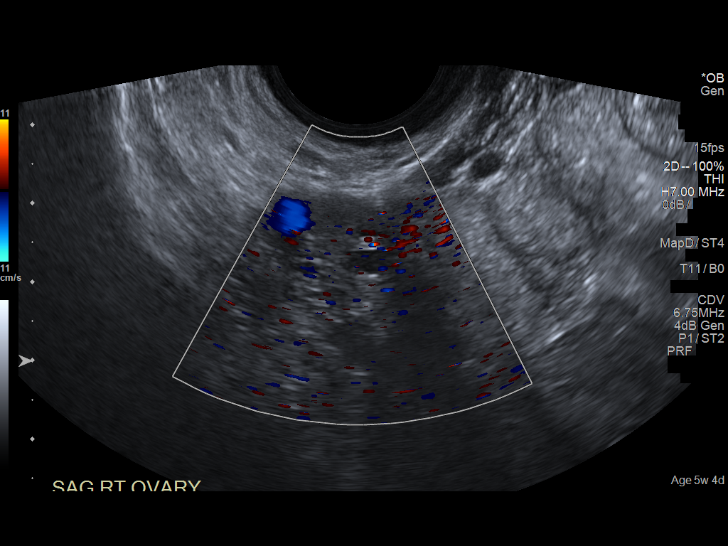
[im 60/95]
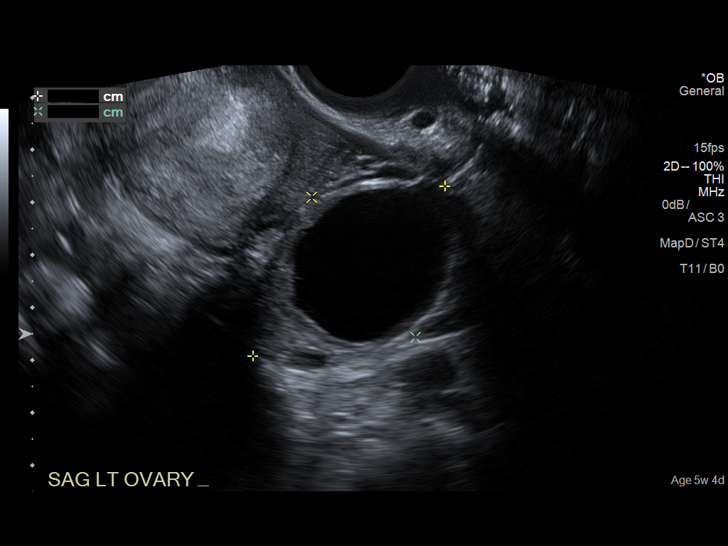
[im 67/95]
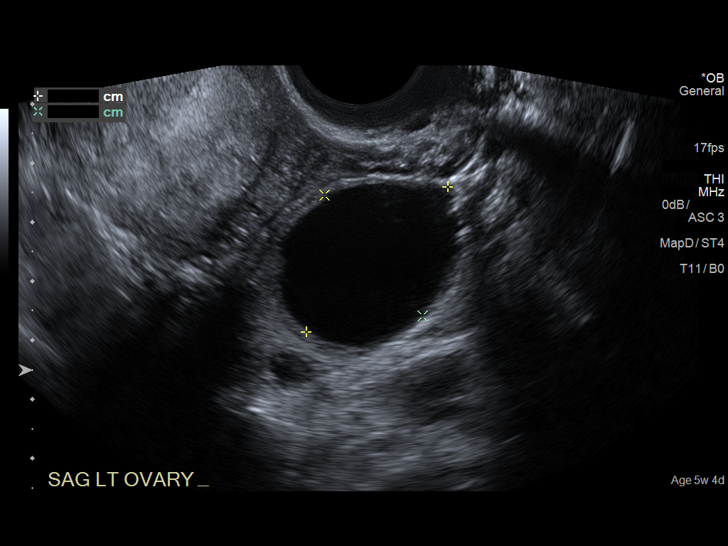
[im 74/95]
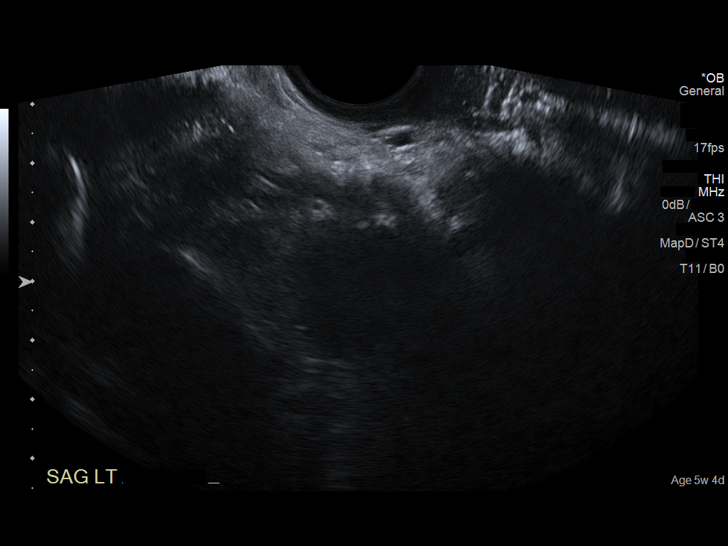
[im 81/95]
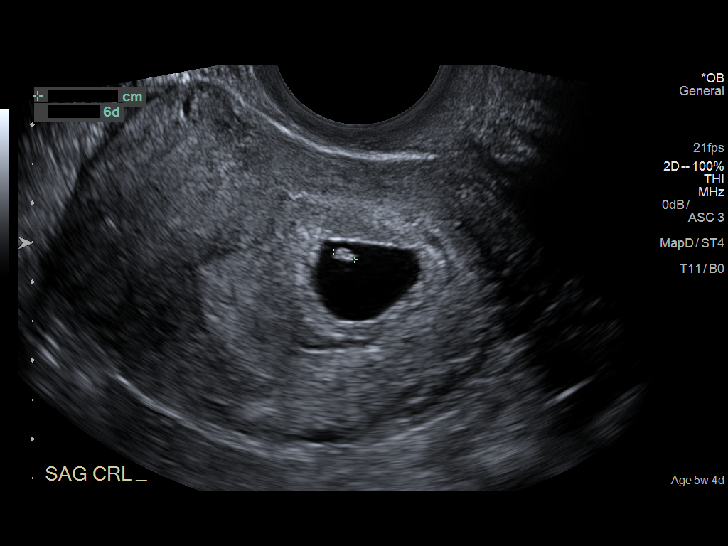
[im 88/95]
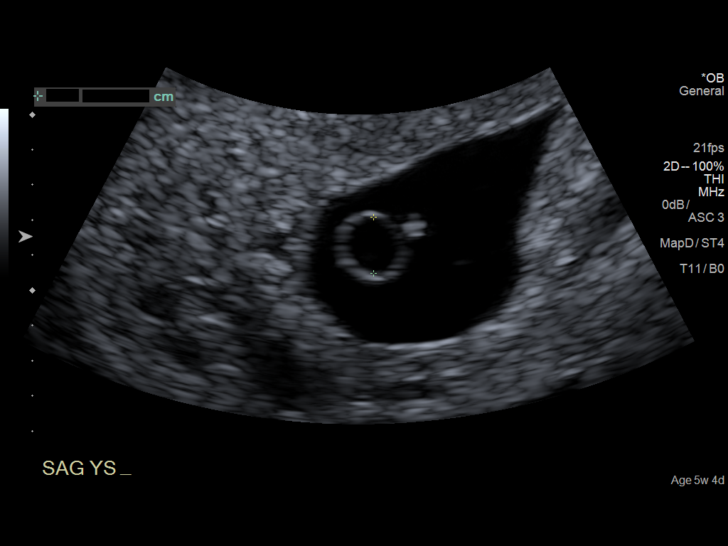
[im 95/95]
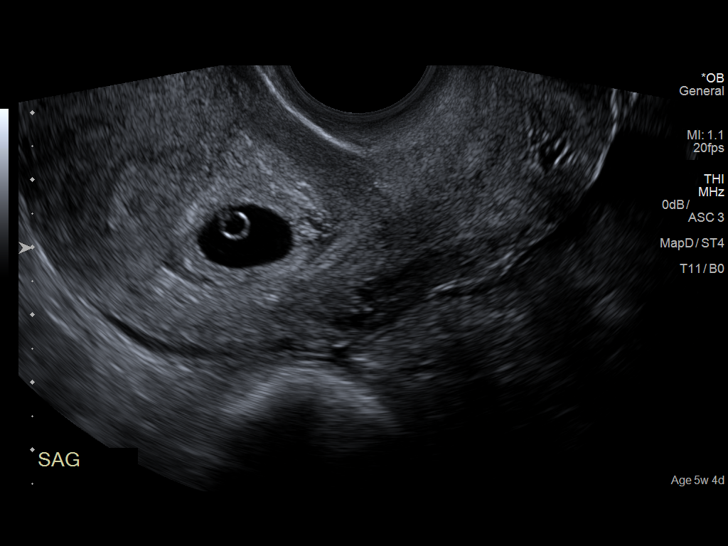

[14 of 28 positions shown; findings below may reference images not displayed]

FINDINGS: Intrauterine gestational sac: Single, normal appearance.

Yolk sac:  Visualized.

Embryo:  Visualized.

Cardiac Activity: Detected

Heart Rate: 104  bpm

CRL:  0.27  cm   5 w   6 d                  US EDC: 07/15/2016

Subchorionic hemorrhage: A subchorionic hemorrhage measuring 2.0 x
0.5 x 0.4 cm is identified

Maternal uterus/adnexae: Simple cyst in left ovary measures 3.4 x
2.6 x 2.9 cm. The right ovary appears normal.
IMPRESSION: Single living intrauterine pregnancy. Subchorionic hemorrhage
measuring 2.0 x 0.5 x 0.4 cm identified.

Simple left ovarian cyst.
